# Patient Record
Sex: Female | Born: 1960 | Race: White | Hispanic: No | Marital: Married | Smoking: Never smoker
Health system: Southern US, Community
[De-identification: ages and names within clinical notes are randomized; demographics above are authoritative.]

## PROBLEM LIST (undated history)

## (undated) DIAGNOSIS — R112 Nausea with vomiting, unspecified: Secondary | ICD-10-CM

## (undated) DIAGNOSIS — Z8489 Family history of other specified conditions: Secondary | ICD-10-CM

## (undated) DIAGNOSIS — Z8719 Personal history of other diseases of the digestive system: Secondary | ICD-10-CM

## (undated) DIAGNOSIS — T8859XA Other complications of anesthesia, initial encounter: Secondary | ICD-10-CM

## (undated) DIAGNOSIS — C44311 Basal cell carcinoma of skin of nose: Secondary | ICD-10-CM

## (undated) DIAGNOSIS — K589 Irritable bowel syndrome without diarrhea: Secondary | ICD-10-CM

## (undated) DIAGNOSIS — J302 Other seasonal allergic rhinitis: Secondary | ICD-10-CM

## (undated) DIAGNOSIS — K219 Gastro-esophageal reflux disease without esophagitis: Secondary | ICD-10-CM

## (undated) DIAGNOSIS — T4145XA Adverse effect of unspecified anesthetic, initial encounter: Secondary | ICD-10-CM

## (undated) DIAGNOSIS — Z9889 Other specified postprocedural states: Secondary | ICD-10-CM

## (undated) DIAGNOSIS — E039 Hypothyroidism, unspecified: Secondary | ICD-10-CM

## (undated) DIAGNOSIS — M1990 Unspecified osteoarthritis, unspecified site: Secondary | ICD-10-CM

## (undated) DIAGNOSIS — J189 Pneumonia, unspecified organism: Secondary | ICD-10-CM

## (undated) HISTORY — PX: CHOLECYSTECTOMY: SHX55

## (undated) HISTORY — PX: SHOULDER ARTHROSCOPY W/ ROTATOR CUFF REPAIR: SHX2400

## (undated) HISTORY — PX: HERNIA REPAIR: SHX51

## (undated) HISTORY — PX: CARPAL TUNNEL RELEASE: SHX101

## (undated) HISTORY — PX: ABDOMINAL HYSTERECTOMY: SHX81

## (undated) HISTORY — PX: INCISIONAL HERNIA REPAIR: SHX193

---

## 1997-09-10 ENCOUNTER — Ambulatory Visit (HOSPITAL_BASED_OUTPATIENT_CLINIC_OR_DEPARTMENT_OTHER): Admission: RE | Admit: 1997-09-10 | Discharge: 1997-09-10 | Payer: Self-pay | Admitting: Orthopedic Surgery

## 1997-10-08 ENCOUNTER — Ambulatory Visit (HOSPITAL_BASED_OUTPATIENT_CLINIC_OR_DEPARTMENT_OTHER): Admission: RE | Admit: 1997-10-08 | Discharge: 1997-10-08 | Payer: Self-pay | Admitting: Orthopedic Surgery

## 1998-01-03 ENCOUNTER — Other Ambulatory Visit: Admission: RE | Admit: 1998-01-03 | Discharge: 1998-01-03 | Payer: Self-pay | Admitting: *Deleted

## 1998-04-09 ENCOUNTER — Other Ambulatory Visit: Admission: RE | Admit: 1998-04-09 | Discharge: 1998-04-09 | Payer: Self-pay | Admitting: *Deleted

## 1998-05-03 ENCOUNTER — Inpatient Hospital Stay (HOSPITAL_COMMUNITY): Admission: AD | Admit: 1998-05-03 | Discharge: 1998-05-03 | Payer: Self-pay | Admitting: *Deleted

## 1998-05-12 ENCOUNTER — Observation Stay (HOSPITAL_COMMUNITY): Admission: AD | Admit: 1998-05-12 | Discharge: 1998-05-12 | Payer: Self-pay | Admitting: *Deleted

## 1998-05-18 ENCOUNTER — Inpatient Hospital Stay (HOSPITAL_COMMUNITY): Admission: AD | Admit: 1998-05-18 | Discharge: 1998-05-18 | Payer: Self-pay | Admitting: *Deleted

## 1998-06-21 ENCOUNTER — Inpatient Hospital Stay (HOSPITAL_COMMUNITY): Admission: AD | Admit: 1998-06-21 | Discharge: 1998-06-21 | Payer: Self-pay | Admitting: Obstetrics and Gynecology

## 1999-02-12 ENCOUNTER — Emergency Department (HOSPITAL_COMMUNITY): Admission: EM | Admit: 1999-02-12 | Discharge: 1999-02-12 | Payer: Self-pay | Admitting: Emergency Medicine

## 1999-02-12 ENCOUNTER — Encounter: Payer: Self-pay | Admitting: Emergency Medicine

## 1999-03-03 ENCOUNTER — Encounter: Payer: Self-pay | Admitting: Surgery

## 1999-03-03 ENCOUNTER — Encounter (INDEPENDENT_AMBULATORY_CARE_PROVIDER_SITE_OTHER): Payer: Self-pay

## 1999-03-03 ENCOUNTER — Observation Stay (HOSPITAL_COMMUNITY): Admission: RE | Admit: 1999-03-03 | Discharge: 1999-03-04 | Payer: Self-pay | Admitting: Surgery

## 1999-09-01 ENCOUNTER — Other Ambulatory Visit: Admission: RE | Admit: 1999-09-01 | Discharge: 1999-09-01 | Payer: Self-pay | Admitting: Obstetrics and Gynecology

## 2000-09-13 ENCOUNTER — Other Ambulatory Visit: Admission: RE | Admit: 2000-09-13 | Discharge: 2000-09-13 | Payer: Self-pay | Admitting: Obstetrics and Gynecology

## 2002-02-09 ENCOUNTER — Other Ambulatory Visit: Admission: RE | Admit: 2002-02-09 | Discharge: 2002-02-09 | Payer: Self-pay | Admitting: Obstetrics and Gynecology

## 2002-04-11 ENCOUNTER — Encounter: Payer: Self-pay | Admitting: Obstetrics and Gynecology

## 2002-04-11 ENCOUNTER — Encounter: Admission: RE | Admit: 2002-04-11 | Discharge: 2002-04-11 | Payer: Self-pay | Admitting: Obstetrics and Gynecology

## 2003-04-04 ENCOUNTER — Other Ambulatory Visit: Admission: RE | Admit: 2003-04-04 | Discharge: 2003-04-04 | Payer: Self-pay | Admitting: Obstetrics and Gynecology

## 2003-05-07 ENCOUNTER — Encounter: Admission: RE | Admit: 2003-05-07 | Discharge: 2003-05-07 | Payer: Self-pay | Admitting: Obstetrics and Gynecology

## 2004-07-08 ENCOUNTER — Other Ambulatory Visit: Admission: RE | Admit: 2004-07-08 | Discharge: 2004-07-08 | Payer: Self-pay | Admitting: Obstetrics and Gynecology

## 2004-07-28 ENCOUNTER — Encounter: Admission: RE | Admit: 2004-07-28 | Discharge: 2004-07-28 | Payer: Self-pay | Admitting: Obstetrics and Gynecology

## 2005-09-07 ENCOUNTER — Encounter: Admission: RE | Admit: 2005-09-07 | Discharge: 2005-09-07 | Payer: Self-pay | Admitting: Obstetrics and Gynecology

## 2005-09-23 ENCOUNTER — Other Ambulatory Visit: Admission: RE | Admit: 2005-09-23 | Discharge: 2005-09-23 | Payer: Self-pay | Admitting: Obstetrics and Gynecology

## 2006-05-17 HISTORY — PX: CARDIAC CATHETERIZATION: SHX172

## 2006-07-15 ENCOUNTER — Encounter: Admission: RE | Admit: 2006-07-15 | Discharge: 2006-07-15 | Payer: Self-pay | Admitting: Obstetrics and Gynecology

## 2007-01-04 ENCOUNTER — Inpatient Hospital Stay (HOSPITAL_COMMUNITY): Admission: EM | Admit: 2007-01-04 | Discharge: 2007-01-06 | Payer: Self-pay | Admitting: Emergency Medicine

## 2007-01-04 ENCOUNTER — Ambulatory Visit: Payer: Self-pay | Admitting: Cardiology

## 2007-01-04 ENCOUNTER — Ambulatory Visit: Payer: Self-pay | Admitting: Internal Medicine

## 2007-01-05 ENCOUNTER — Encounter (INDEPENDENT_AMBULATORY_CARE_PROVIDER_SITE_OTHER): Payer: Self-pay | Admitting: Internal Medicine

## 2007-03-24 ENCOUNTER — Encounter: Admission: RE | Admit: 2007-03-24 | Discharge: 2007-03-24 | Payer: Self-pay | Admitting: Obstetrics and Gynecology

## 2007-04-18 ENCOUNTER — Encounter: Admission: RE | Admit: 2007-04-18 | Discharge: 2007-04-18 | Payer: Self-pay | Admitting: Gastroenterology

## 2007-05-18 HISTORY — PX: NECK SURGERY: SHX720

## 2007-11-28 ENCOUNTER — Encounter: Admission: RE | Admit: 2007-11-28 | Discharge: 2007-11-28 | Payer: Self-pay | Admitting: Orthopedic Surgery

## 2008-02-23 ENCOUNTER — Ambulatory Visit (HOSPITAL_COMMUNITY): Admission: RE | Admit: 2008-02-23 | Discharge: 2008-02-23 | Payer: Self-pay | Admitting: Obstetrics and Gynecology

## 2008-02-23 ENCOUNTER — Encounter (INDEPENDENT_AMBULATORY_CARE_PROVIDER_SITE_OTHER): Payer: Self-pay | Admitting: Obstetrics and Gynecology

## 2008-03-25 ENCOUNTER — Encounter: Admission: RE | Admit: 2008-03-25 | Discharge: 2008-03-25 | Payer: Self-pay | Admitting: Obstetrics and Gynecology

## 2009-03-27 ENCOUNTER — Encounter: Admission: RE | Admit: 2009-03-27 | Discharge: 2009-03-27 | Payer: Self-pay | Admitting: Obstetrics and Gynecology

## 2009-06-04 ENCOUNTER — Ambulatory Visit (HOSPITAL_COMMUNITY): Admission: RE | Admit: 2009-06-04 | Discharge: 2009-06-06 | Payer: Self-pay | Admitting: General Surgery

## 2009-11-12 ENCOUNTER — Encounter: Admission: RE | Admit: 2009-11-12 | Discharge: 2009-11-12 | Payer: Self-pay | Admitting: Family Medicine

## 2010-04-27 ENCOUNTER — Encounter
Admission: RE | Admit: 2010-04-27 | Discharge: 2010-04-27 | Payer: Self-pay | Source: Home / Self Care | Attending: Obstetrics and Gynecology | Admitting: Obstetrics and Gynecology

## 2010-06-07 ENCOUNTER — Encounter: Payer: Self-pay | Admitting: Obstetrics and Gynecology

## 2010-08-02 LAB — BASIC METABOLIC PANEL
BUN: 10 mg/dL (ref 6–23)
CO2: 28 mEq/L (ref 19–32)
Calcium: 8.9 mg/dL (ref 8.4–10.5)
Calcium: 8.1 mg/dL — ABNORMAL LOW (ref 8.4–10.5)
Creatinine, Ser: 0.69 mg/dL (ref 0.4–1.2)
GFR calc Af Amer: >60 mL/min (ref >60)
GFR calc Af Amer: >60 mL/min (ref >60)
GFR calc non Af Amer: >60 mL/min (ref >60)
GFR calc non Af Amer: >60 mL/min (ref >60)
Glucose, Bld: 99 mg/dL (ref 70–99)
Sodium: 137 mEq/L (ref 135–145)

## 2010-08-02 LAB — DIFFERENTIAL
Basophils Absolute: 0 10*3/uL (ref 0.0–0.1)
Eosinophils Absolute: 0.1 10*3/uL (ref 0.0–0.7)
Eosinophils Relative: 2 % (ref 0–5)
Monocytes Absolute: 0.4 10*3/uL (ref 0.1–1.0)

## 2010-08-02 LAB — CBC
Hemoglobin: 12.1 g/dL (ref 12.0–15.0)
WBC: 5.7 10*3/uL (ref 4.0–10.5)
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
## 2010-09-29 NOTE — H&P (Signed)
[name], SCRIVENER                  ACCOUNT NO.:  [id number]   MEDICAL RECORD NO.:  [id number]          PATIENT TYPE:  EMS   LOCATION:  MAJO                         FACILITY:  [facility]   PHYSICIAN:  Ladell Pier, M.D.   DATE OF BIRTH:  Jan 13, 1961   DATE OF ADMISSION:  01/04/2007  DATE OF DISCHARGE:                              HISTORY & PHYSICAL   CHIEF COMPLAINT:  Chest pain.   HISTORY OF PRESENT ILLNESS:  The patient is a 50 year old female, past  medical history significant for obesity and asthma.  The patient states  that yesterday she had a persistent chest pain.  Pain in the center of  chest radiating to her back.  She tried taking Rolaids and Tums without  any relief in her symptoms.  She stated that all night the chest pain  persisted.  It radiates to her back with some tenderness in her back.  She presented to her primary care physician's office this morning.  She  had an EKG done that is different from the EKG she had previously.  She  had some poor R wave progression.  She has no shortness of breath.  She  only experienced the heaviness in her chest.  She had nitroglycerin and  morphine in the emergency room with minimal relief of her chest pain.   PAST MEDICAL HISTORY:  Significant for:  1. Obesity.  2. Asthma.   FAMILY HISTORY:  Mother died of breast cancer at 57.  Father history  unknown.  Three brothers and two sisters that are healthy.   SOCIAL HISTORY:  The patient is married, four children.  She is a  Futures trader.  No tobacco or alcohol use.   MEDICATIONS:  Albuterol p.r.n.   ALLERGIES:  None.   REVIEW OF SYSTEMS:  As stated in the HPI.   PHYSICAL EXAMINATION:  VITAL SIGNS:  Temperature 98.1, blood pressure  113/72, pulse 77, respirations 18, pulse ox 96% on room air.  HEENT:  Normocephalic, atraumatic.  Pupils reactive to light.  Throat  without erythema.  CARDIOVASCULAR:  Regular rate and rhythm.  LUNGS:  Clear bilaterally.  ABDOMEN:  Positive bowel  sounds.  EXTREMITIES:  No edema.   LABORATORY DATA:  EKG showed poor R wave progression.  Chest x-ray:  No  acute disease.  D-dimers are less than 0.22.  Sodium 137, potassium 4.0,  chloride 106, CO2 of 24, BUN 9, creatinine 0.8, glucose 98.   ASSESSMENT AND PLAN:  Chest pain:  Differential diagnoses  gastroesophageal reflux disease versus cardiac versus anxiety versus  aortic dissection versus endocarditis.  The patient has risk factor with  her obesity is her cardiac  risk factor.  She does not have any history of hypertension, diabetes or  high cholesterol.  We will check her cholesterol levels, cycle enzymes.  Get a cardiology consult.  Get a 2-D echocardiogram.  Start her on  aspirin.  Check blood pressure in both arms.      Ladell Pier, M.D.  Electronically Signed     NJ/MEDQ  D:  01/04/2007  T:  01/05/2007  Job:  [id number] 

## 2010-09-29 NOTE — Cardiovascular Report (Signed)
[name], [name]                  ACCOUNT NO.:  [id number]   MEDICAL RECORD NO.:  [id number]          PATIENT TYPE:  INP   LOCATION:  4741                         FACILITY:  [facility]   PHYSICIAN:  Bevelyn Buckles. Bensimhon, MDDATE OF BIRTH:  03-28-1961   DATE OF PROCEDURE:  01/06/2007  DATE OF DISCHARGE:                            CARDIAC CATHETERIZATION   PRIMARY CARE PHYSICIAN:  Evelena Peat, M.D., [facility].   PATIENT IDENTIFICATION:  [name] is a very pleasant 50 year old woman  with a history of morbid obesity and asthma.  She was admitted with  chest pain. EKG was nonacute and she ruled out for myocardial infarction  with serial cardiac markers.  The risks of cath versus stress test were  explained and the patient has decided to pursue catheterization.   PROCEDURES PERFORMED:  1. Selective coronary angiography.  2. Left heart catheterization.  3. Left ventriculogram.  4. Angio-Seal closure of the common femoral arteriotomy site.   DESCRIPTION OF PROCEDURE:  The risks and indication of the  catheterization were explained.  Consent was signed and placed on the  chart.  A 5-French arterial sheath was placed in the right femoral  artery using a modified Seldinger technique.  Standard catheters  including JL-4, JR-4 and angled pigtail were used for the procedure.  All catheter exchanges were made over a wire.  There were no apparent  complications.  At the end the procedure, the arteriotomy site was  closed with Angio-Seal closure device.  There was good hemostasis.   Central aortic pressure 117/80 with a mean of 98.  LV pressure 120/14  with an EDP of 20.  There was no aortic stenosis.   Left main was normal.   LAD was a long vessel coursing to the apex.  It was angiographically  normal.   Left circumflex was a huge dominant vessel with a very large ramus  branch, small OM-1, moderate-to-large OM-2 and a large PDA.  It is  angiographically normal.   Right coronary artery was a small nondominant vessel.  It was  angiographically normal.   Left ventriculogram done in RAO position showed an EF of 55% with normal  wall motion.  No mitral regurgitation.   ASSESSMENT:  1. Normal coronary arteries.  2. Normal left ventricular function.   PLAN/DISCUSSION:  Probable noncardiac chest pain.  She can go home today  if her groin remains stable.  She will need continued risk factor  modification.      Bevelyn Buckles. Bensimhon, MD  Electronically Signed     DRB/MEDQ  D:  01/06/2007  T:  01/07/2007  Job:  [id number]   cc:   Evelena Peat, M.D.

## 2010-09-29 NOTE — Op Note (Signed)
[name], PACIFICO                  ACCOUNT NO.:  [id number]   MEDICAL RECORD NO.:  [id number]          PATIENT TYPE:  AMB   LOCATION:  SDC                           FACILITY:  [facility]   PHYSICIAN:  Dois Davenport A. Rivard, M.D. DATE OF BIRTH:  Mar 06, 1961   DATE OF PROCEDURE:  02/23/2008  DATE OF DISCHARGE:                               OPERATIVE REPORT   PREOPERATIVE DIAGNOSES:  Dysfunctional uterine bleeding with endometrial  polyps and persistent left ovarian cyst.   POSTOPERATIVE DIAGNOSES:  Dysfunctional uterine bleeding with  endometrial polyps and persistent left ovarian cyst plus left paratubal  cyst.   ANESTHESIA:  General, Angelica Pou, MD   PROCEDURE:  Hysteroscopy, resection of endometrial polyps, dilation and  curettage, laparoscopy with left salpingo-oophorectomy.   SURGEON:  Crist Fat. Rivard, MD   ASSISTANT:  Elmira J. Lowell Guitar, physician assistant.   ESTIMATED BLOOD LOSS:  Minimal.   PROCEDURE:  After being informed of the planned procedure with possible  complications including bleeding, infection, injury to uterus, injury to  bowel, bladder, or ureters, need for laparotomy, informed consent was  obtained.  The patient was taken to OR #7, given general anesthesia with  endotracheal intubation without any complication.  She was placed in the  lithotomy position, prepped and draped in a sterile fashion, and a Foley  catheter was inserted in her bladder.  Pelvic exam revealed an  anteverted uterus, somewhat increased in size maybe 8-10 weeks, mobile,  2 normal adnexa.  Please note that pelvic exam is definitely limited by  body habitus.   We placed a weighted speculum in the vagina and grasped the cervix with  the tenaculum forceps.  We proceeded with a paracervical block using 16  mL of Novocaine 1%.  Uterus was sounded at 12-cm and the cervix was  easily dilated using Hegar dilator until #31, which allowed for easy  entry of the hysteroscope with a single loop.  Using  sorbitol with a  maximum pressure of 85 mmHg, we were able to visualize the entire  uterine cavity with both tubal ostia.  We did found a small polyp in the  left upper posterior aspect of the uterus measuring 0.5 cm and also a  polypoid posterior endometrium at the fundus which was maybe on a  distance of 1.5 cm.  Using the single loop, we resected these 2 areas  and sent them separately.  We removed the hysteroscope and performed a  sharp curettage of the endometrial cavity which removed a moderate to  large amount of normal-appearing endometrium.  After the curettage was  completed, we returned inside the uterus with the hysteroscope and  visualized a normal endometrial cavity.  Instruments were then removed.  An acorn intrauterine manipulator was placed in the uterus and the  weighted speculum was removed.  We then moved to the laparoscopy part of  our procedure and we infiltrated the umbilicus with 5 mL of Marcaine  0.25 and performed a semielliptical incision.  This incision was brought  down bluntly to the fascia which was identified and easily grasped with  Kocher forceps.  The fascia was incised using Mayo scissors and the  peritoneum was entered bluntly.  A pursestring suture of 0 Vicryl was  placed on the fascia and a 10-mm Hassan trocar was inserted in the  abdominal cavity, held in place with the pursestring suture.  This  allowed for easy insufflation of a pneumoperitoneum with CO2 at a  maximum pressure of 15 mmHg.  We then infiltrated the suprapubic area  with 5 mL of Marcaine 0.25 and performed a 10-mm incision and inserted a  10-mm trocar under direct visualization, and we then inserted a 5-mm  left lower quadrant trocar under direct physician after infiltrating  with 2 mL of Marcaine 0.25.   OBSERVATIONS:  Anterior cul-de-sac was normal.  The uterus was globulus  and a little soft may be compatible with adenomyosis.  No uterine  fibroids were identified.  Posterior  cul-de-sac was normal.  Right tube  and right ovary were normal.  Left tube had a paratubal cyst measuring  3.3-3.5 cm and the left ovary had a simple clear cyst measuring 4 cm.  Those were easily mobile, but we do have some adhesions with the sigmoid  colon which will shadow the area where we need to identify passage of  the ureter.  The appendix was visualized and normal.  The liver was  visualized and normal.  No other pathology was found in the pelvis.   We proceeded with pelvic washings using the Nezhat irrigator-aspirator,  and then using monopolar hot scissors, we proceeded with sharp  dissection of adhesions to free the sigmoid from the left pelvic wall.  We could now easily identified the infundibulopelvic ligament and also  easily identify the course of the ureter which was way below our site of  cauterization.  Now using a tripolar gyrus energy forceps, we grasped  the left infundibulopelvic ligament and cauterized and sectioned it.  We  then grasped the tube, cauterized and sectioned it.  We can now go  through the mesosalpinx, cauterized and sectioned it, and then we  grasped the utero-ovarian ligament cauterized and sectioned it, which  frees the adnexa with its tube completely.  The site of resection had a  normal hemostasis.  Through the 10-mm suprapubic trocar, we inserted an  Endobag and we deposited the specimen in the bag and brought it outside  the suprapubic area.  Trocar was removed.  The back was opened.  The  specimen was grasped inside the bag, morcellated.  Both cysts were  perforated and aspirated and the specimen was removed completely.  The  10-mm trocar was reinserted and we returned in the pelvic cavity for  profuse irrigation with warm saline.  We do have a small site of oozing  near the left uterine cornua which was cauterized with the tripolar.  We  again irrigated profusely with warm saline and noted a satisfactory  hemostasis with an intact ureter with  normal peristaltism.  Trocars were  removed under direct visualization.  Pneumoperitoneum was evacuated.   The umbilical fascia was closed with a previously placed pursestring  suture of 0 Vicryl and the fascia of the suprapubic incision was closed  with a figure-of-eight stitch of 0 Vicryl.  All incisions were then  closed with subcuticular suture of 3-0 Monocryl and Dermabond.   Instruments and sponge count was complete x2.  Estimated blood loss was  minimal.  The procedure was very well tolerated by the patient who was  taken to recovery room  in a well and stable condition.   SPECIMENS:  Left tube, left ovary, endometrial polyps, and endometrial  curettings, all sent to pathology.      Crist Fat Rivard, M.D.  Electronically Signed     SAR/MEDQ  D:  02/23/2008  T:  02/24/2008  Job:  [id number]
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
## 2010-09-29 NOTE — H&P (Signed)
[name], SULLIVANT                  ACCOUNT NO.:  [id number]   MEDICAL RECORD NO.:  [id number]          PATIENT TYPE:  INP   LOCATION:  4741                         FACILITY:  [facility]   PHYSICIAN:  Ladell Pier, M.D.   DATE OF BIRTH:  23-May-1960   DATE OF ADMISSION:  01/04/2007  DATE OF DISCHARGE:                              HISTORY & PHYSICAL   CHIEF COMPLAINT:  Chest pain.   HISTORY OF PRESENT ILLNESS:  Patient is a 50 year old white female that  presented with past medical history significant for obesity and asthma.  Patient stated for the past day, yesterday, she has been having chest  pain.  She has tried [employer].  She has tried Gas-X without any relief.  Her husband takes some medication for ingestion, reflux symptoms.  She  tried the prescription medication without any relief.  She complains of  chest pressure with heaviness radiating mid scapula and tenderness in  her back region to palpation.  She presented to her primary care  physician this morning secondary to the persistence of the chest pain.  She had an EKG done that showed poor R wave progression compared to her  earlier EKG.  She was sent over to the hospital to be admitted.  She has  no shortness of breath.  In the ER, she received nitroglycerin and  aspirin without any relief of her chest pain.  She also received a GI  cocktail and morphine.  Her chest pain has slightly improved.   PAST MEDICAL HISTORY:  Significant for her obesity and asthma.   FAMILY HISTORY:  Her mother died of breast cancer at 24.  Her father's  history is unknown.   SOCIAL HISTORY:  She is married.  She is a housewife.  She has 4  children.  She does not smoke or drink alcohol or use IV drugs.   MEDICATIONS:  Albuterol p.r.n.   ALLERGIES:  None.   REVIEW OF SYMPTOMS:  As per stated in the HPI.   PHYSICAL EXAMINATION:  VITAL SIGNS:  Temperature 98.1, blood pressure  130/72, pulse 77, respirations 18, pulse oximetry 97% on room air.  HEENT:  Head normocephalic, atraumatic.  Pupils equal, round and  reactive to light.  Throat without erythema.  CARDIOVASCULAR:  Regular rate and rhythm.  LUNGS:  Clear bilaterally.  ABDOMEN:  Positive bowel sounds.  EXTREMITIES:  No edema.   LABORATORY DATA:  Chest x-ray normal.  EKG poor R wave progression.  D-  dimer negative.  Sodium 137, potassium 4.0, chloride 106, CO2 24, BUN 9,  creatinine 0.8, glucose 98.   ASSESSMENT/PLAN:  Chest pain.  Differential diagnosis includes GERD  versus cardiac versus anxiety versus aortic dissection versus  endocarditis versus pulmonary embolism.  She ruled out for PE with  negative D-dimer.  Cardiac enzymes thus far are normal.  We will admit  her to telemetry bed observation.  Will get a cardiology consult, get a  2D echo, start her on aspirin, check enzymes.  Check also fasting lipid  panel.      Ladell Pier, M.D.  Electronically Signed  [place]/MEDQ  D:  01/04/2007  T:  01/05/2007  Job:  [id number]

## 2010-10-13 ENCOUNTER — Ambulatory Visit (HOSPITAL_COMMUNITY)
Admission: RE | Admit: 2010-10-13 | Discharge: 2010-10-13 | Disposition: A | Discharge status: Home / Self Care | Payer: BC Managed Care – PPO | Source: Ambulatory Visit | Attending: Family Medicine | Admitting: Family Medicine

## 2010-10-13 DIAGNOSIS — M79609 Pain in unspecified limb: Secondary | ICD-10-CM | POA: Insufficient documentation

## 2010-10-13 DIAGNOSIS — M7989 Other specified soft tissue disorders: Secondary | ICD-10-CM | POA: Insufficient documentation

## 2011-02-16 LAB — CBC
HCT: 38.2
MCV: 93
Platelets: 284
RBC: 4.1
RDW: 14
WBC: 5.5

## 2011-02-26 LAB — CBC
HCT: 32.7 — ABNORMAL LOW
HCT: 34.9 — ABNORMAL LOW
HCT: 35.8 — ABNORMAL LOW
Hemoglobin: 11.3 — ABNORMAL LOW
Hemoglobin: 11.9 — ABNORMAL LOW
MCHC: 34.7
MCHC: 34.7
MCV: 89.1
MCV: 90.4
MCV: 90.9
Platelets: 228
Platelets: 283
RBC: 4.02
RDW: 13.2
RDW: 13.3
RDW: 13.6

## 2011-02-26 LAB — COMPREHENSIVE METABOLIC PANEL
ALT: 64 — ABNORMAL HIGH
Albumin: 3.2 — ABNORMAL LOW
Alkaline Phosphatase: 67
BUN: 6
CO2: 26
Calcium: 9
Creatinine, Ser: 0.63
GFR calc non Af Amer: >60
Glucose, Bld: 92
Glucose, Bld: 92
Sodium: 138
Total Bilirubin: 0.6
Total Bilirubin: 1
Total Protein: 6

## 2011-02-26 LAB — CARDIAC PANEL(CRET KIN+CKTOT+MB+TROPI)
CK, MB: 0.9
Relative Index: INVALID
Total CK: 56
Troponin I: 0.02

## 2011-02-26 LAB — I-STAT 8, (EC8 V) (CONVERTED LAB)
Bicarbonate: 24.1 — ABNORMAL HIGH
Hemoglobin: 13.3
Potassium: 4
Sodium: 137

## 2011-02-26 LAB — DIFFERENTIAL
Basophils Absolute: 0
Eosinophils Relative: 1
Lymphocytes Relative: 26
Neutro Abs: 3.4

## 2011-02-26 LAB — TROPONIN I: Troponin I: 0.02

## 2011-02-26 LAB — FERRITIN: Ferritin: 88 (ref 10–291)

## 2011-02-26 LAB — MITOCHONDRIAL ANTIBODIES: Mitochondrial M2 Ab, IgG: <20.1 Units (ref <20.1)

## 2011-02-26 LAB — POCT CARDIAC MARKERS
Myoglobin, poc: 36.5
Operator id: 285491
Troponin i, poc: <0.05
Troponin i, poc: <0.05

## 2011-02-26 LAB — ANA: Anti Nuclear Antibody(ANA): NEGATIVE

## 2011-02-26 LAB — CK TOTAL AND CKMB (NOT AT ARMC): Relative Index: INVALID

## 2011-02-26 LAB — LIPID PANEL
LDL Cholesterol: 106 — ABNORMAL HIGH
VLDL: 26

## 2011-02-26 LAB — PROTIME-INR: Prothrombin Time: 13.9

## 2011-02-26 LAB — POCT I-STAT CREATININE
Creatinine, Ser: 0.8
Operator id: 285491

## 2011-02-26 LAB — D-DIMER, QUANTITATIVE (NOT AT ARMC): D-Dimer, Quant: 0.22

## 2011-02-26 LAB — HEPATITIS PANEL, ACUTE: HCV Ab: NEGATIVE

## 2011-04-23 ENCOUNTER — Emergency Department (HOSPITAL_COMMUNITY): Payer: BC Managed Care – PPO

## 2011-04-23 ENCOUNTER — Encounter: Payer: Self-pay | Admitting: *Deleted

## 2011-04-23 ENCOUNTER — Emergency Department (HOSPITAL_COMMUNITY)
Admission: EM | Admit: 2011-04-23 | Discharge: 2011-04-23 | Disposition: A | Discharge status: Home / Self Care | Payer: BC Managed Care – PPO | Attending: Emergency Medicine | Admitting: Emergency Medicine

## 2011-04-23 DIAGNOSIS — J3489 Other specified disorders of nose and nasal sinuses: Secondary | ICD-10-CM | POA: Insufficient documentation

## 2011-04-23 DIAGNOSIS — R059 Cough, unspecified: Secondary | ICD-10-CM | POA: Insufficient documentation

## 2011-04-23 DIAGNOSIS — J069 Acute upper respiratory infection, unspecified: Secondary | ICD-10-CM | POA: Insufficient documentation

## 2011-04-23 DIAGNOSIS — J029 Acute pharyngitis, unspecified: Secondary | ICD-10-CM | POA: Insufficient documentation

## 2011-04-23 DIAGNOSIS — R05 Cough: Secondary | ICD-10-CM

## 2011-04-23 DIAGNOSIS — R0602 Shortness of breath: Secondary | ICD-10-CM | POA: Insufficient documentation

## 2011-04-23 DIAGNOSIS — J45909 Unspecified asthma, uncomplicated: Secondary | ICD-10-CM | POA: Insufficient documentation

## 2011-04-23 DIAGNOSIS — H9209 Otalgia, unspecified ear: Secondary | ICD-10-CM | POA: Insufficient documentation

## 2011-04-23 MED ORDER — ALBUTEROL SULFATE (5 MG/ML) 0.5% IN NEBU
5.0000 mg | INHALATION_SOLUTION | Freq: Once | RESPIRATORY_TRACT | Status: AC
  Administered 2011-04-23: 5 mg via RESPIRATORY_TRACT
  Filled 2011-04-23: qty 1

## 2011-04-23 MED ORDER — PREDNISONE 20 MG PO TABS: ORAL_TABLET | ORAL | Status: AC

## 2011-04-23 MED ORDER — GUAIFENESIN-CODEINE 100-10 MG/5ML PO SYRP: 5.0000 mL | ORAL_SOLUTION | Freq: Three times a day (TID) | ORAL | Status: AC | PRN

## 2011-04-23 MED ORDER — ALBUTEROL SULFATE (2.5 MG/3ML) 0.083% IN NEBU: 2.5000 mg | INHALATION_SOLUTION | RESPIRATORY_TRACT | Status: DC | PRN

## 2011-04-23 MED ORDER — IPRATROPIUM BROMIDE 0.02 % IN SOLN
0.5000 mg | Freq: Once | RESPIRATORY_TRACT | Status: AC
  Administered 2011-04-23: 0.5 mg via RESPIRATORY_TRACT
  Filled 2011-04-23: qty 2.5

## 2011-04-23 NOTE — ED Notes (Signed)
Supplied patients family with soda.

## 2011-04-23 NOTE — ED Notes (Signed)
Patient reports she had the flu 1 week ago.  She developed increasing sob and coughing spells since yesterday.  She is talking in a wisper and breathing fast.

## 2011-04-23 NOTE — ED Notes (Signed)
Has been feeling bad x 1 week  Cough sorethroat aching  Went to dr and was told she had the flu no xray done. Not feeling better  Still coughing  And now has some blood in sputum hurts to breath

## 2011-04-23 NOTE — ED Provider Notes (Signed)
History     CSN: [id number] Arrival date & time: 04/23/2011 10:17 AM   First MD Initiated Contact with Patient 04/23/11 1128      Chief Complaint  Patient presents with  . Shortness of Breath  . URI    (Consider location/radiation/quality/duration/timing/severity/associated sxs/prior treatment) HPI Comments: Patient with history of the flu that began one week ago. Patient has had persisting coughing. She has been placed on a Z-Pak by her primary care doctor. She recently finished a course of steroids. She also has been using hydrocodone cough syrup. Patient hasn't had fever at onset but not over the past couple of days. She has had nasal congestion and cough productive of green sputum. Patient denies vomiting or diarrhea. She has a past medical history of asthma for which she uses albuterol inhaler. She denies COPD or other lung problems. Patient denies smoking.  Patient is a 50 y.o. female presenting with shortness of breath and URI. The history is provided by the patient.  Shortness of Breath  The current episode started more than 1 week ago. Associated symptoms include rhinorrhea, sore throat, cough and shortness of breath. Pertinent negatives include no chest pain, no chest pressure, no fever and no wheezing. She is currently using steroids.  URI The primary symptoms include ear pain, sore throat and cough. Primary symptoms do not include fever, headaches, wheezing, abdominal pain, nausea, vomiting, myalgias or rash.  Symptoms associated with the illness include congestion and rhinorrhea. The illness is not associated with chills.    Past Medical History  Diagnosis Date  . Asthma     History reviewed. No pertinent past surgical history.  No family history on file.  History  Substance Use Topics  . Smoking status: Never Smoker   . Smokeless tobacco: Not on file  . Alcohol Use: No    OB History    Grav Para Term Preterm Abortions TAB SAB Ect Mult Living                   Review of Systems  Constitutional: Negative for fever and chills.  HENT: Positive for ear pain, congestion, sore throat and rhinorrhea.   Eyes: Negative for discharge.  Respiratory: Positive for cough and shortness of breath. Negative for wheezing.   Cardiovascular: Negative for chest pain.  Gastrointestinal: Negative for nausea, vomiting, abdominal pain, diarrhea and constipation.  Genitourinary: Negative for dysuria and hematuria.  Musculoskeletal: Negative for myalgias.  Skin: Negative for rash.  Neurological: Negative for headaches.  Psychiatric/Behavioral: Negative for confusion.    Allergies  Review of patient's allergies indicates no known allergies.  Home Medications   Current Outpatient Rx  Name Route Sig Dispense Refill  . ALBUTEROL SULFATE HFA 108 (90 BASE) MCG/ACT IN AERS Inhalation Inhale 2 puffs into the lungs every 6 (six) hours as needed. Shortness of breath      . ALBUTEROL SULFATE (2.5 MG/3ML) 0.083% IN NEBU Nebulization Take 2.5 mg by nebulization every 6 (six) hours as needed. Shortness of breath     . AZITHROMYCIN 250 MG PO TABS Oral Take 250 mg by mouth daily. On day 3 of course.     Marland Kitchen HYDROCODONE-ACETAMINOPHEN 5-325 MG PO TABS Oral Take 1 tablet by mouth every 6 (six) hours as needed. For pain       BP 98/72  Pulse 83  Temp(Src) 98.4 F (36.9 C) (Oral)  Resp 28  Ht 5\' 5"  (1.651 m)  Wt 265 lb (120.203 kg)  BMI 44.10 kg/m2  SpO2 99%  Physical Exam  Nursing note and vitals reviewed. Constitutional: She is oriented to person, place, and time. She appears well-developed and well-nourished.  HENT:  Head: Normocephalic and atraumatic.  Eyes: Right eye exhibits no discharge. Left eye exhibits no discharge.  Neck: Normal range of motion. Neck supple.  Cardiovascular: Normal rate and regular rhythm.  Exam reveals no gallop and no friction rub.   No murmur heard. Pulmonary/Chest: Effort normal and breath sounds normal. No respiratory distress. She has  no wheezes.        Coughing during exam  Abdominal: Soft. There is no tenderness. There is no rebound and no guarding.  Musculoskeletal: Normal range of motion.  Neurological: She is alert and oriented to person, place, and time.  Skin: Skin is warm and dry. No rash noted.  Psychiatric: She has a normal mood and affect.    ED Course  Procedures (including critical care time)  Labs Reviewed - No data to display Dg Chest 2 View  04/23/2011  *RADIOLOGY REPORT*  Clinical Data: Cough, fever, chills.  CHEST - 2 VIEW  Comparison: 05/30/2009  Findings: Heart and mediastinal contours are within normal limits. No focal opacities or effusions.  No acute bony abnormality.  IMPRESSION: No active cardiopulmonary disease.  Original Report Authenticated By: Cyndie Chime, M.D.     1. Cough    Patient seen and examined. Albuterol nebulizer given with relief of coughing while she was on the albuterol however she started coughing after the treatment. Patient informed that there is no evidence of pneumonia on x-ray. Will continue prednisone with a tapered dose pack, will switch patient's Tussionex to a cough syrup with codeine, and will refill patient's albuterol nebulizer solution. Patient and husband are comfortable with this plan and agree to follow up with primary care doctor if symptoms do not improve, patient urged to return with worsening shortness of breath, cough, fever, or she has any other concerns.  Patient counseled on use of narcotic pain medications. Counseled not to combine these medications with others containing tylenol. Urged not to drink alcohol, drive, or perform any other activities that requires focus while taking these medications. The patient verbalizes understanding and agrees with the plan.  X-ray reviewed by myself.    MDM  Patient with cough, no pneumonia on x-ray. She recently has flu symptoms with persistent cough. She is already on antibiotics. Suspect residual inflammation  of the recent respiratory infection. Patient appears well and is stable for discharge home.       [name] [place], [place] 04/23/11 1630

## 2011-04-23 NOTE — ED Notes (Signed)
Patient transported to X-ray 

## 2011-04-25 NOTE — ED Provider Notes (Signed)
History/physical exam/procedure(s) were performed by non-physician practitioner and as supervising physician I was immediately available for consultation/collaboration. I have reviewed all notes and am in agreement with care and plan.   Cressie Betzler S Marita Burnsed, MD 04/25/11 1059 

## 2011-05-28 ENCOUNTER — Other Ambulatory Visit: Payer: Self-pay | Admitting: Obstetrics and Gynecology

## 2011-05-28 DIAGNOSIS — Z1231 Encounter for screening mammogram for malignant neoplasm of breast: Secondary | ICD-10-CM

## 2011-05-31 ENCOUNTER — Ambulatory Visit
Admission: RE | Admit: 2011-05-31 | Discharge: 2011-05-31 | Disposition: A | Discharge status: Home / Self Care | Payer: BC Managed Care – PPO | Source: Ambulatory Visit | Attending: Obstetrics and Gynecology | Admitting: Obstetrics and Gynecology

## 2011-05-31 DIAGNOSIS — Z1231 Encounter for screening mammogram for malignant neoplasm of breast: Secondary | ICD-10-CM

## 2012-02-11 ENCOUNTER — Telehealth: Payer: Self-pay | Admitting: Obstetrics and Gynecology

## 2012-02-11 NOTE — Telephone Encounter (Signed)
SR pt 

## 2012-02-14 ENCOUNTER — Telehealth: Payer: Self-pay | Admitting: Obstetrics and Gynecology

## 2012-02-14 NOTE — Telephone Encounter (Signed)
Pt went to ER today. CT scan was done." Found cysts on R ovary and prominent endometrium in lining of uterus."  Pt had been spotting for 2 weeks and nausea. Appt sch for 02-15-12  ld

## 2012-02-15 ENCOUNTER — Encounter: Payer: Self-pay | Admitting: Obstetrics and Gynecology

## 2012-02-15 ENCOUNTER — Ambulatory Visit (INDEPENDENT_AMBULATORY_CARE_PROVIDER_SITE_OTHER): Payer: BC Managed Care – PPO

## 2012-02-15 ENCOUNTER — Other Ambulatory Visit: Payer: Self-pay | Admitting: Obstetrics and Gynecology

## 2012-02-15 ENCOUNTER — Ambulatory Visit (INDEPENDENT_AMBULATORY_CARE_PROVIDER_SITE_OTHER): Payer: BC Managed Care – PPO | Admitting: Obstetrics and Gynecology

## 2012-02-15 VITALS — BP 100/72 | Wt 280.0 lb

## 2012-02-15 DIAGNOSIS — N950 Postmenopausal bleeding: Secondary | ICD-10-CM

## 2012-02-15 DIAGNOSIS — R109 Unspecified abdominal pain: Secondary | ICD-10-CM

## 2012-02-15 HISTORY — PX: ROBOTIC ASSISTED TOTAL HYSTERECTOMY: SHX6085

## 2012-02-15 LAB — POCT URINALYSIS DIPSTICK
Blood, UA: NEGATIVE
Ketones, UA: NEGATIVE
Protein, UA: NEGATIVE
Spec Grav, UA: 1.015
Urobilinogen, UA: NEGATIVE
pH, UA: 5

## 2012-02-15 NOTE — Progress Notes (Deleted)
Subjective:     Patient ID: [name], female   DOB: Sep 16, 1960, 51 y.o.   MRN: [id number]  HPI   Review of Systems     Objective:   Physical Exam     Assessment:     ***    Plan:     ***

## 2012-02-15 NOTE — Progress Notes (Signed)
Subjective:    [name] is a 51 y.o. female, G82P4, who presents for spotting and nausea.  Had CT, pos for "cysts and endometrium." Unable to obtain report before 72 hours per [facility] pain on/off for 1 year. Colonoscopy was normal. This past weekend reports severe nausea, no vomiting, feeling really sick. For 2,3 weeks new onset of short stabbing  LLQ pain with random occurrence. CT-scan performed yesterday: pt was told to see OBGYN because endometrium was prominent. Last cycle July 2013 was on time. In past year has had cycles every 1-3 months with occasional use of Provera. Spotting on/off for 3 weeks usually just on paper when wipes. 1-2 episodes of soiling underwear. More watery then bloody discharge.   The following portions of the patient's history were reviewed and updated as appropriate: allergies, current medications, past family history.  Review of Systems Pertinent items are noted in HPI.    Gastrointestinal: Negative for abdominal pain, change in bowel habits or rectal bleeding Urinary:negative   Objective:    There were no vitals taken for this visit.    Weight:  Wt Readings from Last 1 Encounters:  04/23/11 265 lb (120.203 kg)          BMI: There is no height or weight on file to calculate BMI.  General Appearance: Alert, appropriate appearance for age. No acute distress Heart: normal Lungs: clear GYN exam:deferred  Pelvic ultrasound: Uterus normal            EM: increased 1.6 cm, with possibility of EM polyp            Left ovary surgically absent            Right ovary with dominant follicle    Assessment:    Post-menopausal vaginal bleeding  with suspicion of endometrial polyp ( history of)   Plan:    Recommend to proceed with HSC/polypectomy Pt may choose in-office or outpatient venue Procedure reviewed with R&B In-office instructions given on AVS     Butler Denmark PMD

## 2012-02-15 NOTE — Patient Instructions (Signed)
To: [name]  DOB: 28-Aug-1960   Date: 02/15/2012   Patient Instructions for in office Hysteroscopy  Please read several days prior to your procedure and follow the instructions.  If you have any questions, please contact the office.  1.  Pre-procedure and Post-procedure medications:  Pick up Motrin, Valium, Phenergan, and Vicodin from your pharmacy  Bring medications to the office when you come in for your procedure  2.  Begin taking Motrin 800mg  48 hours prior to procedure.  Take 1 tablet every 8 hours.  **Please notify doctor and do not take if you have a history of stomach ulcers or have been notified by a physician that you cannot take Motrin or Ibuprofen.  3. If your physician gave you Cytotec tablets at your appointment, insert 2 tablets into vagina at 6:00 pm the night before the procedure and 2 tablets at 6:00 am the morning of the procedure.  4.  Bring someone to your appointment who can be with you in the preparation area for the hour prior to your procedure, and who can drive you home afterward.  You will be at the office approximately 2 to 3 hours.  No driving within the first 24 hours or while still taking narcotic pain medicine.  5.  Wear comfortable, loose fitting clothing.  6.  DAY OF PROCEDURE:  No breakfast.  No Motrin the morning of the procedure.  No lunch until after your procedure.  Don't forget to bring your medications.  Drink plenty of fluids prior to your procedure and throughout the day.  No driving for 24 hours.  Bring someone with you the day of the appointment that can drive you home.  7.  Following the procedure:  You may experience cramping for 12 - 24 hours after treatment.  Take Motrin every 8 hours as prescribed for 3 doses today, and/Vicodin for breakthrough pain every 4 hours as needed.  You may resume normal activities tomorrow.  You may resume sexual intercourse in 1 week or after discharge has completely resolved.  You may resume  using tampons tomorrow.  Expect to experience vaginal discharge after the treatment for 6 - 10 days.  It is described as bloody during the first few days; pinkish by approximately one week; then profuse and watery thereafter.  If not already scheduled, please schedule your follow-up appointment 1 - 2 weeks from today.  Please take your antibiotics as prescribed by your physician after the procedure. May not apply to all patients.

## 2012-02-16 ENCOUNTER — Telehealth: Payer: Self-pay | Admitting: Obstetrics and Gynecology

## 2012-02-16 NOTE — Telephone Encounter (Signed)
PC from pt stating she and her husband discussed options and have decided on hysterectomy.  Will send this FYI to SR. Told pt she will be contacted for appointment to come in and discuss when/if necessary.  Pt agreeable.  ld

## 2012-02-17 ENCOUNTER — Telehealth: Payer: Self-pay | Admitting: Obstetrics and Gynecology

## 2012-02-22 ENCOUNTER — Encounter (HOSPITAL_COMMUNITY): Payer: Self-pay | Admitting: Pharmacy Technician

## 2012-02-24 ENCOUNTER — Encounter (HOSPITAL_COMMUNITY): Payer: Self-pay

## 2012-02-24 ENCOUNTER — Inpatient Hospital Stay (HOSPITAL_COMMUNITY): Admission: RE | Admit: 2012-02-24 | Payer: BC Managed Care – PPO | Source: Ambulatory Visit

## 2012-02-24 HISTORY — DX: Irritable bowel syndrome, unspecified: K58.9

## 2012-02-25 ENCOUNTER — Encounter (HOSPITAL_COMMUNITY): Payer: Self-pay

## 2012-02-25 ENCOUNTER — Encounter (HOSPITAL_COMMUNITY)
Admission: RE | Admit: 2012-02-25 | Discharge: 2012-02-25 | Disposition: A | Discharge status: Home / Self Care | Payer: BC Managed Care – PPO | Source: Ambulatory Visit | Attending: Obstetrics and Gynecology | Admitting: Obstetrics and Gynecology

## 2012-02-25 HISTORY — DX: Adverse effect of unspecified anesthetic, initial encounter: T41.45XA

## 2012-02-25 HISTORY — DX: Personal history of other diseases of the digestive system: Z87.19

## 2012-02-25 HISTORY — DX: Other specified postprocedural states: Z98.890

## 2012-02-25 HISTORY — DX: Other specified postprocedural states: R11.2

## 2012-02-25 HISTORY — DX: Hypothyroidism, unspecified: E03.9

## 2012-02-25 HISTORY — DX: Other complications of anesthesia, initial encounter: T88.59XA

## 2012-02-25 HISTORY — DX: Gastro-esophageal reflux disease without esophagitis: K21.9

## 2012-02-25 LAB — CBC
HCT: 37.8 % (ref 36.0–46.0)
Hemoglobin: 12.3 g/dL (ref 12.0–15.0)
MCV: 90.4 fL (ref 78.0–100.0)
RDW: 13.8 % (ref 11.5–15.5)
WBC: 5.5 10*3/uL (ref 4.0–10.5)

## 2012-02-25 LAB — SURGICAL PCR SCREEN
MRSA, PCR: NEGATIVE
Staphylococcus aureus: NEGATIVE

## 2012-02-25 NOTE — Patient Instructions (Signed)
Your procedure is scheduled on: 03/09/12  Enter through the Main Entrance at :1130 am Pick up desk phone and dial [phone] and inform us of your arrival.  Please call [phone] if you have any problems the morning of surgery.  Remember: Do not eat after midnight:Wed. Do not drink after:9am on Thursday  Take these meds the morning of surgery with a sip of water: thyroid med, IBS med  DO NOT wear jewelry, eye make-up, lipstick,body lotion, or dark fingernail polish. Do not shave for 48 hours prior to surgery.  If you are to be admitted after surgery, leave suitcase in car until your room has been assigned. Patients discharged on the day of surgery will not be allowed to drive home.   Remember to use your Hibiclens as instructed.

## 2012-02-29 ENCOUNTER — Telehealth: Payer: Self-pay | Admitting: Obstetrics and Gynecology

## 2012-02-29 NOTE — Telephone Encounter (Signed)
Robotic Assisted Hysterectomy: Oopherectomy scheduled for 03/09/12 @ 1:00 with SR/EP.  BCBS effective and pays 70/30 after a $ 3,5000 deductible. Pre-cert not required. Pre-op due $449.35. -Adrianne Pridgen

## 2012-02-29 NOTE — Telephone Encounter (Signed)
Robotic Assisted Hysterectomy scheduled for 03/09/12 @ 1:00 with SR/EP.  BCBS effective and pays 70/30 after a $ 3,5000 deductible. Pre-cert not required. Pre-op due $449.35. -Adrianne Pridgen

## 2012-03-01 ENCOUNTER — Other Ambulatory Visit: Payer: Self-pay | Admitting: Obstetrics and Gynecology

## 2012-03-01 ENCOUNTER — Encounter: Payer: Self-pay | Admitting: Obstetrics and Gynecology

## 2012-03-01 NOTE — Progress Notes (Unsigned)
  Following visit of 02/15/12, patient desires to proceed with robotic hysterectomy and RSO because of recurrence of PMB and family history of ovarian cancer. Will schedule and plan pre-op visit.

## 2012-03-02 ENCOUNTER — Encounter: Payer: Self-pay | Admitting: Obstetrics and Gynecology

## 2012-03-02 ENCOUNTER — Other Ambulatory Visit (HOSPITAL_COMMUNITY): Payer: BC Managed Care – PPO

## 2012-03-02 ENCOUNTER — Ambulatory Visit (INDEPENDENT_AMBULATORY_CARE_PROVIDER_SITE_OTHER): Payer: BC Managed Care – PPO | Admitting: Obstetrics and Gynecology

## 2012-03-02 VITALS — BP 110/70 | HR 76 | Temp 98.0°F | Resp 16 | Ht 63.5 in | Wt 244.0 lb

## 2012-03-02 DIAGNOSIS — Z01818 Encounter for other preprocedural examination: Secondary | ICD-10-CM
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
## 2012-03-07 ENCOUNTER — Telehealth: Payer: Self-pay | Admitting: Obstetrics and Gynecology

## 2012-03-07 NOTE — Telephone Encounter (Signed)
Pt calling requesting rx version of Miralax.  Pharmacist states insurance will cover rx and be cheaper for pt.  Will consult EP and /or SR.  Ld Ok to call in 8.3 oz Miralax no refills per EP.  Pt notified. ld

## 2012-03-08 MED ORDER — CEFOTETAN DISODIUM 2 G IJ SOLR
2.0000 g | INTRAMUSCULAR | Status: AC
  Administered 2012-03-09: 2 g via INTRAVENOUS
  Filled 2012-03-08: qty 2
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
## 2012-03-09 ENCOUNTER — Encounter (HOSPITAL_COMMUNITY): Payer: Self-pay | Admitting: *Deleted

## 2012-03-09 ENCOUNTER — Ambulatory Visit (HOSPITAL_COMMUNITY): Payer: BC Managed Care – PPO | Admitting: Anesthesiology

## 2012-03-09 ENCOUNTER — Ambulatory Visit (HOSPITAL_COMMUNITY)
Admission: RE | Admit: 2012-03-09 | Discharge: 2012-03-10 | Disposition: A | Discharge status: Home / Self Care | Payer: BC Managed Care – PPO | Source: Ambulatory Visit | Attending: Obstetrics and Gynecology | Admitting: Obstetrics and Gynecology

## 2012-03-09 ENCOUNTER — Encounter (HOSPITAL_COMMUNITY)
Admission: RE | Disposition: A | Discharge status: Home / Self Care | Payer: Self-pay | Source: Ambulatory Visit | Attending: Obstetrics and Gynecology

## 2012-03-09 ENCOUNTER — Encounter (HOSPITAL_COMMUNITY): Payer: Self-pay | Admitting: Anesthesiology

## 2012-03-09 ENCOUNTER — Other Ambulatory Visit: Payer: Self-pay | Admitting: Obstetrics and Gynecology

## 2012-03-09 DIAGNOSIS — N949 Unspecified condition associated with female genital organs and menstrual cycle: Secondary | ICD-10-CM

## 2012-03-09 DIAGNOSIS — N925 Other specified irregular menstruation: Secondary | ICD-10-CM

## 2012-03-09 DIAGNOSIS — N736 Female pelvic peritoneal adhesions (postinfective): Secondary | ICD-10-CM

## 2012-03-09 DIAGNOSIS — Z23 Encounter for immunization: Secondary | ICD-10-CM

## 2012-03-09 DIAGNOSIS — N938 Other specified abnormal uterine and vaginal bleeding: Secondary | ICD-10-CM | POA: Insufficient documentation

## 2012-03-09 DIAGNOSIS — Z9889 Other specified postprocedural states: Secondary | ICD-10-CM

## 2012-03-09 HISTORY — PX: SALPINGOOPHORECTOMY: SHX82

## 2012-03-09 HISTORY — PX: CYSTOSCOPY: SHX5120

## 2012-03-09 LAB — COMPREHENSIVE METABOLIC PANEL
ALT: 68 U/L — ABNORMAL HIGH (ref 0–35)
AST: 23 U/L (ref 0–37)
Alkaline Phosphatase: 108 U/L (ref 39–117)
CO2: 25 mEq/L (ref 19–32)
Chloride: 98 mEq/L (ref 96–112)
GFR calc Af Amer: >90 mL/min (ref >90)
GFR calc non Af Amer: >90 mL/min (ref >90)
Glucose, Bld: 91 mg/dL (ref 70–99)
Potassium: 4 mEq/L (ref 3.5–5.1)
Sodium: 135 mEq/L (ref 135–145)
Total Bilirubin: 0.4 mg/dL (ref 0.3–1.2)

## 2012-03-09 SURGERY — ROBOTIC ASSISTED TOTAL HYSTERECTOMY
Anesthesia: General | Site: Bladder | Laterality: Right | Wound class: Clean Contaminated

## 2012-03-09 MED ORDER — BUPIVACAINE HCL (PF) 0.25 % IJ SOLN
INTRAMUSCULAR | Status: DC | PRN
  Administered 2012-03-09: 18 mL

## 2012-03-09 MED ORDER — PNEUMOCOCCAL VAC POLYVALENT 25 MCG/0.5ML IJ INJ
0.5000 mL | INJECTION | INTRAMUSCULAR | Status: AC
Start: 2012-03-10 — End: 2012-03-09
  Administered 2012-03-09: 0.5 mL via INTRAMUSCULAR
  Filled 2012-03-09: qty 0.5

## 2012-03-09 MED ORDER — LACTATED RINGERS IR SOLN
Status: DC | PRN
  Administered 2012-03-09: 3000 mL

## 2012-03-09 MED ORDER — PHENYLEPHRINE HCL 10 MG/ML IJ SOLN: INTRAMUSCULAR | Status: DC | PRN

## 2012-03-09 MED ORDER — PROPOFOL 10 MG/ML IV EMUL
INTRAVENOUS | Status: AC
  Filled 2012-03-09: qty 20

## 2012-03-09 MED ORDER — ONDANSETRON HCL 4 MG PO TABS: 4.0000 mg | ORAL_TABLET | Freq: Three times a day (TID) | ORAL | Status: DC | PRN

## 2012-03-09 MED ORDER — ONDANSETRON HCL 4 MG/2ML IJ SOLN
INTRAMUSCULAR | Status: AC
  Filled 2012-03-09: qty 2

## 2012-03-09 MED ORDER — IBUPROFEN 600 MG PO TABS: 600.0000 mg | ORAL_TABLET | Freq: Four times a day (QID) | ORAL | Status: DC | PRN

## 2012-03-09 MED ORDER — INDIGOTINDISULFONATE SODIUM 8 MG/ML IJ SOLN
INTRAMUSCULAR | Status: DC | PRN
  Administered 2012-03-09: 5 mL via INTRAVENOUS

## 2012-03-09 MED ORDER — SCOPOLAMINE 1 MG/3DAYS TD PT72
MEDICATED_PATCH | TRANSDERMAL | Status: AC
  Administered 2012-03-09: 1.5 mg via TRANSDERMAL
  Filled 2012-03-09: qty 1

## 2012-03-09 MED ORDER — INFLUENZA VIRUS VACC SPLIT PF IM SUSP
0.5000 mL | INTRAMUSCULAR | Status: AC
  Administered 2012-03-09: 0.5 mL via INTRAMUSCULAR

## 2012-03-09 MED ORDER — MENTHOL 3 MG MT LOZG: 1.0000 | LOZENGE | OROMUCOSAL | Status: DC | PRN

## 2012-03-09 MED ORDER — KETOROLAC TROMETHAMINE 30 MG/ML IJ SOLN
30.0000 mg | Freq: Once | INTRAMUSCULAR | Status: AC
  Administered 2012-03-09: 30 mg via INTRAVENOUS

## 2012-03-09 MED ORDER — ROCURONIUM BROMIDE 100 MG/10ML IV SOLN
INTRAVENOUS | Status: DC | PRN
  Administered 2012-03-09: 10 mg via INTRAVENOUS
  Administered 2012-03-09: 30 mg via INTRAVENOUS
  Administered 2012-03-09: 10 mg via INTRAVENOUS
  Administered 2012-03-09: 40 mg via INTRAVENOUS
  Administered 2012-03-09: 10 mg via INTRAVENOUS

## 2012-03-09 MED ORDER — FENTANYL CITRATE 0.05 MG/ML IJ SOLN
INTRAMUSCULAR | Status: DC | PRN
  Administered 2012-03-09 (×3): 50 ug via INTRAVENOUS
  Administered 2012-03-09: 100 ug via INTRAVENOUS
  Administered 2012-03-09 (×2): 50 ug via INTRAVENOUS

## 2012-03-09 MED ORDER — LACTATED RINGERS IV SOLN
INTRAVENOUS | Status: DC
  Administered 2012-03-09 (×5): via INTRAVENOUS

## 2012-03-09 MED ORDER — ALBUTEROL SULFATE HFA 108 (90 BASE) MCG/ACT IN AERS: 2.0000 | INHALATION_SPRAY | Freq: Four times a day (QID) | RESPIRATORY_TRACT | Status: DC | PRN

## 2012-03-09 MED ORDER — ROCURONIUM BROMIDE 50 MG/5ML IV SOLN
INTRAVENOUS | Status: AC
  Filled 2012-03-09: qty 2

## 2012-03-09 MED ORDER — OXYCODONE-ACETAMINOPHEN 5-325 MG PO TABS: 1.0000 | ORAL_TABLET | ORAL | Status: DC | PRN

## 2012-03-09 MED ORDER — KETOROLAC TROMETHAMINE 30 MG/ML IJ SOLN
INTRAMUSCULAR | Status: AC
  Filled 2012-03-09: qty 1

## 2012-03-09 MED ORDER — LEVOTHYROXINE SODIUM 75 MCG PO TABS
75.0000 ug | ORAL_TABLET | Freq: Every day | ORAL | Status: DC
Start: 2012-03-10 — End: 2012-03-10
  Filled 2012-03-09: qty 1

## 2012-03-09 MED ORDER — FENTANYL CITRATE 0.05 MG/ML IJ SOLN
INTRAMUSCULAR | Status: AC
  Administered 2012-03-09: 50 ug via INTRAVENOUS
  Filled 2012-03-09: qty 2

## 2012-03-09 MED ORDER — MIDAZOLAM HCL 5 MG/5ML IJ SOLN
INTRAMUSCULAR | Status: DC | PRN
  Administered 2012-03-09: 2 mg via INTRAVENOUS

## 2012-03-09 MED ORDER — SCOPOLAMINE 1 MG/3DAYS TD PT72
1.0000 | MEDICATED_PATCH | Freq: Once | TRANSDERMAL | Status: DC
  Administered 2012-03-09: 1.5 mg via TRANSDERMAL

## 2012-03-09 MED ORDER — HYDROCODONE-ACETAMINOPHEN 5-325 MG PO TABS
1.0000 | ORAL_TABLET | ORAL | Status: DC | PRN
  Administered 2012-03-10: 1 via ORAL
  Filled 2012-03-09: qty 1

## 2012-03-09 MED ORDER — METOCLOPRAMIDE HCL 5 MG/ML IJ SOLN
INTRAMUSCULAR | Status: AC
  Administered 2012-03-09: 10 mg via INTRAVENOUS
  Filled 2012-03-09: qty 2

## 2012-03-09 MED ORDER — METOCLOPRAMIDE HCL 5 MG/ML IJ SOLN
10.0000 mg | Freq: Once | INTRAMUSCULAR | Status: AC | PRN
  Administered 2012-03-09: 10 mg via INTRAVENOUS

## 2012-03-09 MED ORDER — GLYCOPYRROLATE 0.2 MG/ML IJ SOLN
INTRAMUSCULAR | Status: AC
  Filled 2012-03-09: qty 4

## 2012-03-09 MED ORDER — NEOSTIGMINE METHYLSULFATE 1 MG/ML IJ SOLN
INTRAMUSCULAR | Status: DC | PRN
  Administered 2012-03-09: 3 mg via INTRAVENOUS

## 2012-03-09 MED ORDER — GLYCOPYRROLATE 0.2 MG/ML IJ SOLN
INTRAMUSCULAR | Status: DC | PRN
  Administered 2012-03-09: 0.6 mg via INTRAVENOUS

## 2012-03-09 MED ORDER — NEOSTIGMINE METHYLSULFATE 1 MG/ML IJ SOLN
INTRAMUSCULAR | Status: AC
  Filled 2012-03-09: qty 10

## 2012-03-09 MED ORDER — LACTATED RINGERS IV SOLN
INTRAVENOUS | Status: DC
  Administered 2012-03-09: 22:00:00 via INTRAVENOUS

## 2012-03-09 MED ORDER — IBUPROFEN 600 MG PO TABS: ORAL_TABLET | ORAL | Status: DC

## 2012-03-09 MED ORDER — LUBIPROSTONE 24 MCG PO CAPS
24.0000 ug | ORAL_CAPSULE | Freq: Two times a day (BID) | ORAL | Status: DC
  Administered 2012-03-09: 24 ug via ORAL
  Filled 2012-03-09 (×2): qty 1

## 2012-03-09 MED ORDER — PHENYLEPHRINE 40 MCG/ML (10ML) SYRINGE FOR IV PUSH (FOR BLOOD PRESSURE SUPPORT)
PREFILLED_SYRINGE | INTRAVENOUS | Status: AC
  Filled 2012-03-09: qty 5

## 2012-03-09 MED ORDER — MEPERIDINE HCL 25 MG/ML IJ SOLN: 6.2500 mg | INTRAMUSCULAR | Status: DC | PRN

## 2012-03-09 MED ORDER — FENTANYL CITRATE 0.05 MG/ML IJ SOLN
INTRAMUSCULAR | Status: AC
  Filled 2012-03-09: qty 2

## 2012-03-09 MED ORDER — MIDAZOLAM HCL 2 MG/2ML IJ SOLN
INTRAMUSCULAR | Status: AC
  Filled 2012-03-09: qty 2

## 2012-03-09 MED ORDER — LIDOCAINE HCL (CARDIAC) 20 MG/ML IV SOLN
INTRAVENOUS | Status: AC
  Filled 2012-03-09: qty 5

## 2012-03-09 MED ORDER — DEXAMETHASONE SODIUM PHOSPHATE 4 MG/ML IJ SOLN
INTRAMUSCULAR | Status: DC | PRN
  Administered 2012-03-09: 8 mg via INTRAVENOUS

## 2012-03-09 MED ORDER — DEXAMETHASONE SODIUM PHOSPHATE 10 MG/ML IJ SOLN
INTRAMUSCULAR | Status: AC
  Filled 2012-03-09: qty 1

## 2012-03-09 MED ORDER — LIDOCAINE HCL (CARDIAC) 20 MG/ML IV SOLN
INTRAVENOUS | Status: DC | PRN
  Administered 2012-03-09: 30 mg via INTRAVENOUS

## 2012-03-09 MED ORDER — ARTIFICIAL TEARS OP OINT
TOPICAL_OINTMENT | OPHTHALMIC | Status: AC
  Filled 2012-03-09: qty 3.5

## 2012-03-09 MED ORDER — LIDOCAINE-EPINEPHRINE (PF) 1 %-1:200000 IJ SOLN
INTRAMUSCULAR | Status: AC
  Filled 2012-03-09: qty 10

## 2012-03-09 MED ORDER — HYDROMORPHONE HCL PF 1 MG/ML IJ SOLN
INTRAMUSCULAR | Status: DC | PRN
  Administered 2012-03-09 (×2): 0.5 mg via INTRAVENOUS

## 2012-03-09 MED ORDER — FENTANYL CITRATE 0.05 MG/ML IJ SOLN
INTRAMUSCULAR | Status: AC
  Filled 2012-03-09: qty 5

## 2012-03-09 MED ORDER — PHENYLEPHRINE HCL 10 MG/ML IJ SOLN
INTRAMUSCULAR | Status: DC | PRN
  Administered 2012-03-09 (×7): 40 ug via INTRAVENOUS

## 2012-03-09 MED ORDER — FAMOTIDINE IN NACL 20-0.9 MG/50ML-% IV SOLN
20.0000 mg | Freq: Once | INTRAVENOUS | Status: AC
  Administered 2012-03-09: 20 mg via INTRAVENOUS
  Filled 2012-03-09: qty 50

## 2012-03-09 MED ORDER — ACETAMINOPHEN 10 MG/ML IV SOLN
1000.0000 mg | Freq: Four times a day (QID) | INTRAVENOUS | Status: DC
  Administered 2012-03-09: 1000 mg via INTRAVENOUS
  Filled 2012-03-09: qty 100

## 2012-03-09 MED ORDER — BUPIVACAINE HCL (PF) 0.25 % IJ SOLN
INTRAMUSCULAR | Status: AC
  Filled 2012-03-09: qty 30

## 2012-03-09 MED ORDER — FENTANYL CITRATE 0.05 MG/ML IJ SOLN
25.0000 ug | INTRAMUSCULAR | Status: DC | PRN
  Administered 2012-03-09 (×3): 50 ug via INTRAVENOUS

## 2012-03-09 MED ORDER — PROPOFOL 10 MG/ML IV EMUL
INTRAVENOUS | Status: DC | PRN
  Administered 2012-03-09: 200 mg via INTRAVENOUS

## 2012-03-09 MED ORDER — HYDROMORPHONE HCL PF 1 MG/ML IJ SOLN
INTRAMUSCULAR | Status: AC
  Filled 2012-03-09: qty 1

## 2012-03-09 SURGICAL SUPPLY — 47 items
ADH SKN CLS APL DERMABOND .7 (GAUZE/BANDAGES/DRESSINGS) ×4
BAG URINE DRAINAGE (UROLOGICAL SUPPLIES) ×5 IMPLANT
CABLE HIGH FREQUENCY MONO STRZ (ELECTRODE) ×4 IMPLANT
CATH FOLEY 3WAY  5CC 16FR (CATHETERS) ×1
CATH FOLEY 3WAY 5CC 16FR (CATHETERS) ×4 IMPLANT
CHLORAPREP W/TINT 26ML (MISCELLANEOUS) ×7 IMPLANT
CLOTH BEACON ORANGE TIMEOUT ST (SAFETY) ×5 IMPLANT
CONT PATH 16OZ SNAP LID 3702 (MISCELLANEOUS) ×5 IMPLANT
COVER TABLE BACK 60X90 (DRAPES) ×5 IMPLANT
COVER TIP SHEARS 8 DVNC (MISCELLANEOUS) ×2 IMPLANT
COVER TIP SHEARS 8MM DA VINCI (MISCELLANEOUS) ×2
DECANTER SPIKE VIAL GLASS SM (MISCELLANEOUS) IMPLANT
DERMABOND ADVANCED (GAUZE/BANDAGES/DRESSINGS) ×1
DERMABOND ADVANCED .7 DNX12 (GAUZE/BANDAGES/DRESSINGS) ×1 IMPLANT
DRAPE ROBOTICS STRL (DRAPES) ×2 IMPLANT
DRAPE WARM FLUID 44X44 (DRAPE) ×2 IMPLANT
ELECT REM PT RETURN 9FT ADLT (ELECTROSURGICAL) ×5
ELECTRODE REM PT RTRN 9FT ADLT (ELECTROSURGICAL) ×4 IMPLANT
GLOVE BIO SURGEON STRL SZ 6.5 (GLOVE) ×4 IMPLANT
GLOVE BIOGEL PI IND STRL 7.0 (GLOVE) ×8 IMPLANT
GLOVE BIOGEL PI INDICATOR 7.0 (GLOVE) ×2
GLOVE ECLIPSE 6.5 STRL STRAW (GLOVE) ×10 IMPLANT
GOWN STRL REIN XL XLG (GOWN DISPOSABLE) ×30 IMPLANT
KIT ACCESSORY DA VINCI DISP (KITS) ×1
KIT ACCESSORY DVNC DISP (KITS) ×4 IMPLANT
LEGGING LITHOTOMY PAIR STRL (DRAPES) ×2 IMPLANT
OCCLUDER COLPOPNEUMO (BALLOONS) ×4 IMPLANT
PACK LAVH (CUSTOM PROCEDURE TRAY) ×5 IMPLANT
PLUG CATH AND CAP STER (CATHETERS) ×5 IMPLANT
PROTECTOR NERVE ULNAR (MISCELLANEOUS) ×10 IMPLANT
SET CYSTO W/LG BORE CLAMP LF (SET/KITS/TRAYS/PACK) ×2 IMPLANT
SET IRRIG TUBING LAPAROSCOPIC (IRRIGATION / IRRIGATOR) ×2 IMPLANT
SOLUTION ELECTROLUBE (MISCELLANEOUS) ×2 IMPLANT
SUT VIC AB 0 CT1 27 (SUTURE) ×35
SUT VIC AB 0 CT1 27XBRD ANBCTR (SUTURE) ×8 IMPLANT
SUT VIC AB 0 CT1 27XBRD ANTBC (SUTURE) ×5 IMPLANT
SUT VICRYL 0 UR6 27IN ABS (SUTURE) ×10 IMPLANT
SUT VICRYL 4-0 PS2 18IN ABS (SUTURE) ×10 IMPLANT
SYSTEM CONVERTIBLE TROCAR (TROCAR) ×2 IMPLANT
TIP UTERINE 6.7X8CM BLUE DISP (MISCELLANEOUS) ×2 IMPLANT
TOWEL OR 17X24 6PK STRL BLUE (TOWEL DISPOSABLE) ×12 IMPLANT
TROCAR 12M 150ML BLUNT (TROCAR) ×2 IMPLANT
TROCAR DISP BLADELESS 8 DVNC (TROCAR) ×1 IMPLANT
TROCAR DISP BLADELESS 8MM (TROCAR) ×1
TROCAR XCEL NON-BLD 5MMX100MML (ENDOMECHANICALS) ×2 IMPLANT
WARMER LAPAROSCOPE (MISCELLANEOUS) ×2 IMPLANT
WATER STERILE IRR 1000ML POUR (IV SOLUTION) ×10 IMPLANT

## 2012-03-09 NOTE — Discharge Summary (Signed)
  Physician Discharge Summary  Patient ID: [name] MRN: [id number] DOB/AGE: May 13, 1961 51 y.o.  Admit date: 03/09/2012 Discharge date: 03/09/2012   Discharge Diagnoses: Dysfunctional Perimenopausal Bleeding,  Pelvic Pain and Abdominal/Pelvic Adhesions Active Problems:  * No active hospital problems. *    Operation:  Robot Assisted Total Laparoscopic Hysterectomy with Right Salpingo-oophorectomy, Lysis of Adhesions and Cystoscopy   Discharged Condition: Good  Hospital Course: On the date of admission, the patient underwent the aforementioned procedures, tolerating them well.  Post operative course was unremarkable with patient resuming bowel and bladder function on the day of surgery and therefore was deemed ready to discharge home.  Disposition: 01-Home or Self Care  Discharge Medications:   Ahmoni, Edge  Home Medication Instructions [id number]   Printed on:03/09/12 1811  Medication Information                    albuterol (PROVENTIL HFA;VENTOLIN HFA) 108 (90 BASE) MCG/ACT inhaler Inhale 2 puffs into the lungs every 6 (six) hours as needed. Shortness of breath             HYDROcodone-acetaminophen (NORCO) 5-325 MG per tablet Take 1 tablet by mouth every 6 (six) hours as needed. For pain            Probiotic Product (ALIGN PO) Take 1 capsule by mouth every morning.           Linaclotide (LINZESS) 290 MCG CAPS Take 1 capsule by mouth every morning.           esomeprazole (NEXIUM) 40 MG capsule Take 40 mg by mouth daily before breakfast.           levothyroxine (SYNTHROID, LEVOTHROID) 75 MCG tablet Take 75 mcg by mouth daily.           ibuprofen (ADVIL,MOTRIN) 600 MG tablet 1 po pc every 6 hours x 5 days then prn for pain           ondansetron (ZOFRAN) 4 MG tablet Take 1 tablet (4 mg total) by mouth every 8 (eight) hours as needed for nausea.               Follow-up:  Dr. Estanislado Pandy, March 23, 2012 at 10:50 a.m.   SignedHenreitta Leber, PA-C 03/09/2012,  6:11 PM

## 2012-03-09 NOTE — Preoperative (Signed)
Beta Blockers   Reason not to administer Beta Blockers:Not Applicable 

## 2012-03-09 NOTE — Transfer of Care (Signed)
Immediate Anesthesia Transfer of Care Note  Patient: [name]  Procedure(s) Performed: Procedure(s) (LRB) with comments: ROBOTIC ASSISTED TOTAL HYSTERECTOMY (N/A) SALPINGO OOPHERECTOMY (Right) LYSIS OF ADHESION (N/A) CYSTOSCOPY (N/A)  Patient Location: PACU  Anesthesia Type: General  Level of Consciousness: awake, alert , oriented and patient cooperative  Airway & Oxygen Therapy: Patient Spontanous Breathing and Patient connected to nasal cannula oxygen  Post-op Assessment: Report given to PACU RN and Post -op Vital signs reviewed and stable  Post vital signs: Reviewed and stable  Complications: No apparent anesthesia complications

## 2012-03-09 NOTE — Anesthesia Preprocedure Evaluation (Signed)
Anesthesia Evaluation  Patient identified by MRN, date of birth, ID band Patient awake    Reviewed: Allergy & Precautions, H&P , NPO status , Patient's Chart, lab work & pertinent test results  History of Anesthesia Complications (+) PONV  Airway Mallampati: III TM Distance: >3 FB Neck ROM: Full    Dental No notable dental hx. (+) Teeth Intact   Pulmonary asthma ,  breath sounds clear to auscultation  Pulmonary exam normal       Cardiovascular Rhythm:Regular Rate:Normal     Neuro/Psych negative neurological ROS  negative psych ROS   GI/Hepatic Neg liver ROS, GERD-  Medicated and Controlled,IBS   Endo/Other  Hypothyroidism Morbid obesity  Renal/GU negative Renal ROS  negative genitourinary   Musculoskeletal negative musculoskeletal ROS (+)   Abdominal (+) + obese,   Peds  Hematology negative hematology ROS (+)   Anesthesia Other Findings   Reproductive/Obstetrics negative OB ROS                           Anesthesia Physical Anesthesia Plan  ASA: III  Anesthesia Plan: General   Post-op Pain Management:    Induction: Intravenous  Airway Management Planned: Oral ETT  Additional Equipment:   Intra-op Plan:   Post-operative Plan: Extubation in OR  Informed Consent: I have reviewed the patients History and Physical, chart, labs and discussed the procedure including the risks, benefits and alternatives for the proposed anesthesia with the patient or authorized representative who has indicated his/her understanding and acceptance.   Dental advisory given  Plan Discussed with: CRNA, Anesthesiologist and Surgeon  Anesthesia Plan Comments:         Anesthesia Quick Evaluation

## 2012-03-09 NOTE — Interval H&P Note (Signed)
History and Physical Interval Note:  03/09/2012 1:01 PM  [name]  has presented today for surgery, with the diagnosis of Post Menopausal Bleeding  The various methods of treatment have been discussed with the patient and family. After consideration of risks, benefits and other options for treatment, the patient has consented to  Procedure(s) (LRB) with comments: ROBOTIC ASSISTED TOTAL HYSTERECTOMY WITH BILATERAL SALPINGO OOPHERECTOMY (Right) -   as a surgical intervention .  The patient's history has been reviewed, patient examined, no change in status, stable for surgery.  I have reviewed the patient's chart and labs.  Questions were answered to the patient's satisfaction.  Pt had mesh repair of umbilical hernia since last laparoscopy. Informed will try to go above mesh today.   [name] A

## 2012-03-09 NOTE — Op Note (Signed)
Preoperative diagnosis: Recurrent perimenopausal dysfunctional uterine bleeding  Postoperative diagnosis: same with abdominal adhesions  Anesthesia: Gen.  Anesthesiologist: Dr. Mal Amabile  Surgery: Robotically assisted total hysterectomy with right salpingo-oophorectomy and lysis of adhesions, cystoscopy  Surgeon: Dr. Dois Davenport Maynor Mwangi  Assistant: Henreitta Leber PA-C  Estimated blood loss: 125 cc  Procedure:  After being informed of the planned procedure with possible complications including but not limited to bleeding, infection, injury to other organs, need for laparotomy, surgical menopause, expected hospitals they in recovery, informed consent was obtained and patient was taken to or #7. She was given general anesthesia with endotracheal intubation without any complication. She was placed in the lithotomy position on a sticky mattress and beanbag with both arms padded and tucked on each side and knee-high sequential compressive devices. She was prepped and draped in the sterile fashion. A Foley catheter was inserted in her bladder. Pelvic exam reveals an anteverted uterus normal in size and shape and adnexa is not felt due to body habitus. A weighted speculum is inserted in the vagina and the anterior lip of the cervix is grasped with a tenaculum forcep. Uterus is sounded at 10 cm and the cervix was easily dilated using Hegar dilator until #27. This allows for easy placement of an intrauterine #8 RUMI manipulator with the 3.5 KOH ring and a vaginal occluder. The KOH ring is sutured to the cervix with 0 Vicryl.  We measure 20 cm x 15 cm a round the umbilicus to estimate the placement of the previous mesh when the patient underwent laparoscopic umbilical hernia repair. We measure 3 cm above the estimated superior margin and infiltrated the area with 8 cc of Marcaine 0.25. We perform a 10 mm vertical incision which is brought down bluntly to the fascia. The fascia is identified and grasped with  Coker forceps. The fascia is incised with Mayo scissors. Peritoneum is entered bluntly. A pursestring suture of 0 Vicryl is placed on the fascia and a 10 mm Hassan trocar is inserted in the abdominal cavity held in place by the pursestring suture. This allows for easy insufflation of the pneumoperitoneum using warmed CO2 at a maximum pressure of 15 mm mercury. We have cleared the mesh which is easily seen on the abdominal wall. We then placed a 8mm robotic trocar on the left, 8mm robotic trocar on the right and a 5 mm patient's side assistant trocar on the right all under direct visualization after infiltrating with Marcaine 0.25.  Observation: We note extensive adhesions between the omentum and the anterior abdominal wall covering most of the previously described mesh. Going around these adhesions we can see a normal anterior cul-de-sac, a slightly bulky uterus with no evidence of fibroids, and normal posterior cul-de-sac, normal right tube and ovary. We also see normal appendix with normal liver.  We then side dock the robot on the left and insert a PK gyrus forcep and arm # 2 and a monopolar scissor in arm #1. Preparation and docking takes 50 minutes. We proceed with systematic lysis of adhesion using monopolar scissors and bipolar cauterization until the whole omentum is freed from the anterior abdominal wall. Lysis of adhesion accounts for 35 minutes of consult time. We can now concentrate our attention towards the pelvis. We start on the right side by cauterizing the round ligament and sectioning it. We then cauterized the infundibulopelvic ligament and sectioned at always keeping the right ureter under direct visitation. The ureters course is easily identified. We can now cauterized and section the  mesosalpinx to join the round ligament which allows [place] to now addressed the broad ligament. The anterior sheath of the broad ligament is easily dissected with sharp scissors all the way across the lower uterine  segment. The posterior sheath of the broad ligament is easily dissected down to work the posterior KOH ring keeping the ureter under direct visitation. We then proceed with sharp and blunt dissection of the bladder all the way to 1 cm below the KOH ring. Proper dissection of the bladder as confirmed by filling the bladder with 200 cc of saline. The bladder is then is then emptied and we proceed with skeletonization of the vascular bundle on the right. With pressure on the KOH ring we can now cauterize the uterine artery and its ascending branch at the level of the KOH ring as well as the uterine vein. We moved to the left side where the round ligaments cauterized and section. This gives [place] access into the periuterine space by separating the anterior and posterior sheath of the broad ligament. We sharply dissected the anterior sheet to meet the previously placed anterior dissection to complete and the bladder dissection below the KOH ring. The left ureter is visualized in its upper portion below the level of the posterior sheath dissection which is brought to the KOH ring. We can now skeletonized the vascular bundle and cauterized the uterine artery in its ascending branch at the level of the KOH ring. Uterine veins are also cauterized. The vaginal occluder is inflated allowing [place] to perform a 360 colpotomy with a open monopolar scissors freeing the uterus entirely. The uterus and its right adnexa are delivered vaginally. Instruments are then modified for a suture cut in arm #1 and a long tip forcep and arm #2. 4-0 Vicryl suture are cut to 10 inches and are dropped in the abdominal cavity via the 10 mm Hassan trocar. All 4 needles are recuperated and attached to the right abdominal wall. One needle is used to start closing of the vaginal cuff with figure-of-eight stitches of 0 Vicryl. Using all 4 sutures we perform complete closure with hemostasis of the cuff. Needles are then again attached to the abdominal wall a  waiting retrievable. We proceed with profuse irrigation with warm saline and complete hemostasis on peritoneal edges using bipolar cauterization. Hemostasis was now deemed adequate. The right ureter is visualized and normal with normal peristaltic is him and note outpatient. The left ureter is more difficult to visualize but no dilated patient is noted.  Console time is 2 hours and 9 minutes.  The robot is undocked and we changed to a 5 mm camera in order to retrieve all 4 needles from the abdominal wall. This was performed without incident. To be noted a small hematoma at the area of needle placement is seen through the peritoneum on the right anterior abdominal wall. It measures approximately 4 cm x 4 cm and has remained completely stable through the procedure. No active bleeding is noted.  We then removed all trochars under direct position after evacuating the pneumoperitoneum. The fascia of the supraumbilical incision is closed with a Purstring suture of 0 Vicryl. The skin of all incision is closed with subcuticular suture of 3-0 Monocryl and Dermabond.  Indigo carmine is given by anesthesiology and we proceed with a postoperative cystoscopy to rapidly confirm an intact bladder and 2 normal ureteral jets. The bladder is drained. A vaginal speculum is inserted to confirm a adequate closure of the vaginal cuff with satisfactory hemostasis.  Instrument and sponge count is complete x2. Estimated blood loss is 125 cc. The procedure is very well tolerated by the patient is taken to recovery room in a well and stable condition.  Specimen: uterus with the right tube and right ovary weighing 169 g sent to pathology.

## 2012-03-09 NOTE — Anesthesia Postprocedure Evaluation (Signed)
Anesthesia Post Note  Patient: [name]  Procedure(s) Performed: Procedure(s) (LRB): ROBOTIC ASSISTED TOTAL HYSTERECTOMY (N/A) SALPINGO OOPHERECTOMY (Right) LYSIS OF ADHESION (N/A) CYSTOSCOPY (N/A)  Anesthesia type: GA  Patient location: PACU  Post pain: Pain level controlled  Post assessment: Post-op Vital signs reviewed  Last Vitals:  Filed Vitals:   03/09/12 2000  BP: 121/58  Pulse: 97  Temp: 36.9 C  Resp: 15    Post vital signs: Reviewed  Level of consciousness: sedated  Complications: No apparent anesthesia complications

## 2012-03-10 ENCOUNTER — Encounter (HOSPITAL_COMMUNITY): Payer: Self-pay | Admitting: Obstetrics and Gynecology

## 2012-03-10 NOTE — Progress Notes (Signed)
Pt discharged to home with family.  Left in wheelchair.  Teaching complete.

## 2012-03-14 ENCOUNTER — Telehealth: Payer: Self-pay | Admitting: Obstetrics and Gynecology

## 2012-03-14 NOTE — Telephone Encounter (Signed)
Pt taking Ibuprofen 600mg   q6 hours and Percocet q 4hours.  Pt states she is having severe back pain from surgery. Notified pt that unfortunately this can be a side effect from surgery.  Pt states she has tried all different positions with no relief.  meds aren't helping either.  Suggested that she try laying flat with legs elevated with or without heating pad.  Will send note to SR for review and instructions.  Pt to call this pm if worsening.  Pt agreeable.  Post op is 03-23-12.  ld

## 2012-03-14 NOTE — Telephone Encounter (Signed)
sr pt 

## 2012-03-23 ENCOUNTER — Encounter: Payer: Self-pay | Admitting: Obstetrics and Gynecology

## 2012-03-23 ENCOUNTER — Ambulatory Visit (INDEPENDENT_AMBULATORY_CARE_PROVIDER_SITE_OTHER): Payer: BC Managed Care – PPO | Admitting: Obstetrics and Gynecology

## 2012-03-23 VITALS — BP 106/60 | Temp 98.6°F | Ht 64.0 in | Wt 274.0 lb

## 2012-03-23 DIAGNOSIS — N949 Unspecified condition associated with female genital organs and menstrual cycle: Secondary | ICD-10-CM

## 2012-03-23 DIAGNOSIS — N938 Other specified abnormal uterine and vaginal bleeding: Secondary | ICD-10-CM

## 2012-03-23 NOTE — Progress Notes (Signed)
Surgery Robotic Hysterectomy and RSO   Date: 03/09/2012  Eating a regular diet without difficulty. Bowel movements are normal.  Pain is controlled with current analgesics. Medications being used: ibuprofen (OTC).  Bladder function is returned to normal. Vaginal bleeding: none Vaginal discharge: no vaginal discharge   Subjective:     [name] is a 51 y.o. female who presents for post-op visit.  Pathology report was reviewed with patient.  Diagnosis Uterus and cervix, with right tube and ovary - CERVIX: NABOTHIAN CYST. - ENDOMETRIUM: INACTIVE. NO EVIDENCE OR HYPERPLASIA OR CARCINOMA. - UTERINE SEROSA: FIBROUS ADHESIONS. NO ENDOMETRIOSIS OR EVIDENCE OF MALIGNANCY. - RIGHT FALLOPIAN TUBE: UNREMARKABLE. - RIGHT OVARY: BENIGN SEROUS CYST. NO ENDOMETRIOSIS OR EVIDENCE OF MALIGNANCY. Jimmy Picket MD Pathologist, Electronic Signature (Case signed 03/13/2012)  The following portions of the patient's history were reviewed and updated as appropriate: allergies, current medications, past family history, past medical history, past social history, past surgical history and problem list.  Review of Systems Pertinent items are noted in HPI.   Objective:    BP 106/60  Temp 98.6 F (37 C)  Ht 5\' 4"  (1.626 m)  Wt 274 lb (124.286 kg)  BMI 47.03 kg/m2  LMP 02/23/2012 Weight:  Wt Readings from Last 1 Encounters:  03/23/12 274 lb (124.286 kg)    BMI: Body mass index is 47.03 kg/(m^2).  General Appearance: Alert, appropriate appearance for age. No acute distress Lungs: clear to auscultation bilaterally Back: No CVA tenderness Cardiovascular: Regular rate and rhythm. S1, S2, no murmur Gastrointestinal: Soft, non-tender, no masses or organomegaly Incision/s: healing well Pelvic Exam: vaginal cuff healing well   Bimanual exam normal  Assessment:    Doing well postoperatively. Operative findings again reviewed.   Plan:   Follow-up 4 weeks fur cuff check  Silverio Lay  MD 11/7/20131:44 PM

## 2012-04-21 ENCOUNTER — Ambulatory Visit (INDEPENDENT_AMBULATORY_CARE_PROVIDER_SITE_OTHER): Payer: BC Managed Care – PPO | Admitting: Obstetrics and Gynecology

## 2012-04-21 ENCOUNTER — Encounter: Payer: Self-pay | Admitting: Obstetrics and Gynecology

## 2012-04-21 VITALS — BP 106/58 | Wt 276.0 lb

## 2012-04-21 DIAGNOSIS — N949 Unspecified condition associated with female genital organs and menstrual cycle: Secondary | ICD-10-CM

## 2012-04-21 DIAGNOSIS — N938 Other specified abnormal uterine and vaginal bleeding: Secondary | ICD-10-CM

## 2012-04-21 NOTE — Progress Notes (Signed)
Surgery: Hysterectomy   Date: 03/09/2012  Eating a regular diet with difficulty. Bowel movements are normal.  Pain is controlled with current analgesics. Medications being used: ibuprofen (OTC).  Bladder function is not returned to normal. Vaginal bleeding: none Vaginal discharge: no vaginal discharge Pt stated that if she doesn't wake up during the night to use the rest room she has bad pains in her stomach in the morning . Pt stated sometimes  her panties are wet due to not getting up.pt stated she uses the bathroom 2 times a night to use the bathroom .    Subjective:     [name] is a 51 y.o. female who presents for post-op visit.  The following portions of the patient's history were reviewed and updated as appropriate: allergies, current medications, past family history, past medical history, past social history, past surgical history and problem list.  Review of Systems Pertinent items are noted in HPI.   Objective:    BP 106/58  Wt 276 lb (125.193 kg)  LMP 02/23/2012 Weight:  Wt Readings from Last 1 Encounters:  04/21/12 276 lb (125.193 kg)    BMI: There is no height on file to calculate BMI.  General Appearance: Alert, appropriate appearance for age. No acute distress Lungs: clear to auscultation bilaterally Back: No CVA tenderness Cardiovascular: Regular rate and rhythm. S1, S2, no murmur Gastrointestinal: Soft, non-tender, no masses or organomegaly Incision/s: healing well Pelvic Exam: vaginal cuff healing well   Bimanual exam normal  Assessment:    Doing well postoperatively. Operative findings again reviewed.   Plan:   Normal post-op  AEX 1 year  Silverio Lay MD 12/6/201312:53 PM

## 2012-05-09 ENCOUNTER — Ambulatory Visit: Payer: Self-pay | Admitting: Obstetrics and Gynecology

## 2012-05-17 HISTORY — PX: MOHS SURGERY: SUR867

## 2012-06-08 ENCOUNTER — Ambulatory Visit: Payer: BC Managed Care – PPO | Admitting: Obstetrics and Gynecology

## 2012-08-24 ENCOUNTER — Other Ambulatory Visit: Payer: Self-pay

## 2012-08-24 DIAGNOSIS — Z1231 Encounter for screening mammogram for malignant neoplasm of breast: Secondary | ICD-10-CM

## 2012-10-02 ENCOUNTER — Other Ambulatory Visit: Payer: Self-pay | Admitting: Obstetrics and Gynecology

## 2012-10-02 ENCOUNTER — Other Ambulatory Visit: Payer: Self-pay

## 2012-10-02 ENCOUNTER — Ambulatory Visit
Admission: RE | Admit: 2012-10-02 | Discharge: 2012-10-02 | Disposition: A | Discharge status: Home / Self Care | Payer: BC Managed Care – PPO | Source: Ambulatory Visit

## 2012-10-02 DIAGNOSIS — N6310 Unspecified lump in the right breast, unspecified quadrant: Secondary | ICD-10-CM

## 2012-10-02 DIAGNOSIS — Z1231 Encounter for screening mammogram for malignant neoplasm of breast: Secondary | ICD-10-CM

## 2012-10-17 ENCOUNTER — Ambulatory Visit
Admission: RE | Admit: 2012-10-17 | Discharge: 2012-10-17 | Disposition: A | Discharge status: Home / Self Care | Payer: BC Managed Care – PPO | Source: Ambulatory Visit | Attending: Obstetrics and Gynecology | Admitting: Obstetrics and Gynecology

## 2012-10-17 ENCOUNTER — Other Ambulatory Visit: Payer: Self-pay | Admitting: Obstetrics and Gynecology

## 2012-10-17 ENCOUNTER — Ambulatory Visit
Admission: RE | Admit: 2012-10-17 | Discharge: 2012-10-17 | Disposition: A | Discharge status: Home / Self Care | Payer: BC Managed Care – PPO | Source: Ambulatory Visit

## 2012-10-17 DIAGNOSIS — N6310 Unspecified lump in the right breast, unspecified quadrant: Secondary | ICD-10-CM

## 2012-11-01 ENCOUNTER — Other Ambulatory Visit: Payer: Self-pay | Admitting: Obstetrics and Gynecology

## 2013-04-16 DIAGNOSIS — J189 Pneumonia, unspecified organism: Secondary | ICD-10-CM

## 2013-04-16 HISTORY — DX: Pneumonia, unspecified organism: J18.9

## 2013-07-05 ENCOUNTER — Encounter (INDEPENDENT_AMBULATORY_CARE_PROVIDER_SITE_OTHER): Payer: Self-pay | Admitting: Surgery

## 2013-07-17 ENCOUNTER — Ambulatory Visit (INDEPENDENT_AMBULATORY_CARE_PROVIDER_SITE_OTHER): Payer: BC Managed Care – PPO | Admitting: Surgery

## 2013-07-17 ENCOUNTER — Encounter (INDEPENDENT_AMBULATORY_CARE_PROVIDER_SITE_OTHER): Payer: Self-pay | Admitting: Surgery

## 2013-07-17 ENCOUNTER — Other Ambulatory Visit (INDEPENDENT_AMBULATORY_CARE_PROVIDER_SITE_OTHER): Payer: Self-pay | Admitting: Surgery

## 2013-07-17 VITALS — BP 117/78 | HR 76 | Temp 97.2°F | Resp 16 | Ht 64.0 in | Wt 288.2 lb

## 2013-07-17 DIAGNOSIS — K439 Ventral hernia without obstruction or gangrene: Secondary | ICD-10-CM

## 2013-07-17 DIAGNOSIS — K432 Incisional hernia without obstruction or gangrene: Secondary | ICD-10-CM

## 2013-07-17 DIAGNOSIS — R109 Unspecified abdominal pain: Secondary | ICD-10-CM

## 2013-07-17 NOTE — Progress Notes (Signed)
Patient ID: [name], female   DOB: [birth year], 53 y.o.   MRN: [id number]  Chief Complaint  Patient presents with  . Hernia    new pt- eval abd hernia    HPI [name] is a 53 y.o. female.   HPI This is a very pleasant female referred by Leonia Reader PA for evaluation of a possible recurrent incisional hernia. The patient has noticed an increasing bulge in her epigastric area for last several months. Prior to this and she's been having left upper quadrant abdominal pain. The pain hurts around to her back. She has had nausea. She is chronically constipated. The pain is cramping in nature with a dull ache. She has had no vomiting. Past Medical History  Diagnosis Date  . Asthma   . IBS (irritable bowel syndrome)   . Hypothyroidism   . GERD (gastroesophageal reflux disease)   . H/O constipation     current IBS med has relieved constipation  . Cancer     basal cell skin cancer  . Complication of anesthesia     asthma attack when waking up after intubation  . PONV (postoperative nausea and vomiting)     Past Surgical History  Procedure Laterality Date  . Carpal tunnel release    . Gallbladder surgery    . Shoulder open rotator cuff repair    . Oophorectomy    . Cholecystectomy    . Neck surgery    . Cardiac catheterization    . Salpingoophorectomy  03/09/2012    Procedure: SALPINGO OOPHERECTOMY;  Surgeon: [name], MD;  Location: [facility];  Service: Gynecology;  Laterality: Right;  . Cystoscopy  03/09/2012    Procedure: CYSTOSCOPY;  Surgeon: [name], MD;  Location: [facility];  Service: Gynecology;  Laterality: N/A;  . Robotic assisted total hysterectomy    . Abdominal hysterectomy    . Hernia repair      umb hernia/incisional    Family History  Problem Relation Age of Onset  . Diabetes Paternal Grandmother   . Diabetes Maternal Grandmother   . Diabetes Father   . Hypertension Father   . Breast cancer Mother   . Hypertension Mother   . Stroke Mother   .  Breast cancer Maternal Aunt   . Cancer Maternal Uncle     sarcoma    Social History History  Substance Use Topics  . Smoking status: Never Smoker   . Smokeless tobacco: Never Used  . Alcohol Use: No    No Known Allergies  Current Outpatient Prescriptions  Medication Sig Dispense Refill  . albuterol (PROVENTIL HFA;VENTOLIN HFA) 108 (90 BASE) MCG/ACT inhaler Inhale 2 puffs into the lungs every 6 (six) hours as needed. Shortness of breath        . esomeprazole (NEXIUM) 40 MG capsule Take 40 mg by mouth daily before breakfast.      . ibuprofen (ADVIL,MOTRIN) 600 MG tablet 1 po pc every 6 hours x 5 days then prn for pain  30 tablet  1  . levothyroxine (SYNTHROID, LEVOTHROID) 75 MCG tablet Take 75 mcg by mouth daily.       No current facility-administered medications for this visit.    Review of Systems Review of Systems  Constitutional: Negative for fever, chills and unexpected weight change.  HENT: Negative for congestion, hearing loss, sore throat, trouble swallowing and voice change.   Eyes: Negative for visual disturbance.  Respiratory: Negative for cough and wheezing.   Cardiovascular: Negative for chest  pain, palpitations and leg swelling.  Gastrointestinal: Positive for nausea, abdominal pain and constipation. Negative for vomiting, diarrhea, blood in stool, abdominal distention and anal bleeding.  Genitourinary: Negative for hematuria, vaginal bleeding and difficulty urinating.  Musculoskeletal: Negative for arthralgias.  Skin: Negative for rash and wound.  Neurological: Negative for seizures, syncope and headaches.  Hematological: Negative for adenopathy. Does not bruise/bleed easily.  Psychiatric/Behavioral: Negative for confusion.    Blood pressure 117/78, pulse 76, temperature 97.2 F (36.2 C), temperature source Oral, resp. rate 16, height 5\' 4"  (1.626 m), weight 288 lb 3.2 oz (130.727 kg), last menstrual period 02/23/2012.  Physical Exam Physical Exam   Constitutional: She is oriented to person, place, and time. She appears well-developed. No distress.  Morbidly obese  HENT:  Head: Normocephalic and atraumatic.  Right Ear: External ear normal.  Left Ear: External ear normal.  Nose: Nose normal.  Mouth/Throat: Oropharynx is clear and moist. No oropharyngeal exudate.  Eyes: Conjunctivae are normal. Pupils are equal, round, and reactive to light. Right eye exhibits no discharge. Left eye exhibits no discharge. No scleral icterus.  Neck: Normal range of motion. Neck supple. No tracheal deviation present.  Cardiovascular: Normal rate, regular rhythm, normal heart sounds and intact distal pulses.   No murmur heard. Pulmonary/Chest: Effort normal and breath sounds normal. She has no wheezes. She has no rales.  Abdominal: Soft. Bowel sounds are normal. There is tenderness.  She has a reducible hernia in her upper abdomen that appears above the previous repair. She has tenderness in the left upper quadrant  Musculoskeletal: Normal range of motion. She exhibits no edema and no tenderness.  Lymphadenopathy:    She has no cervical adenopathy.  Neurological: She is alert and oriented to person, place, and time.  Skin: Skin is warm and dry. No rash noted. She is not diaphoretic. No erythema.  Psychiatric: Her behavior is normal.    Data Reviewed   Assessment    Recurrent incisional hernia as well as left sided abdominal pain and tenderness     Plan    She does have a recurrent hernia but it is difficult to tell if some of her symptoms are related to a hernia or not. I believe she needs a preoperative CAT scan of her abdomen and pelvis to see how much bowel may be involved in the hernia given her previous mesh as well as to determine whether there is some other etiology of the left-sided and left flank discomfort preoperatively. I will call her back with results of the CAT scan and we will then determine how to proceed         Xabi Wittler A 07/17/2013, 10:38 AM

## 2013-07-18 ENCOUNTER — Other Ambulatory Visit (INDEPENDENT_AMBULATORY_CARE_PROVIDER_SITE_OTHER): Payer: Self-pay | Admitting: Surgery

## 2013-07-18 ENCOUNTER — Telehealth (INDEPENDENT_AMBULATORY_CARE_PROVIDER_SITE_OTHER): Payer: Self-pay | Admitting: *Deleted

## 2013-07-18 NOTE — Telephone Encounter (Signed)
Thanks I will send message to schedulers

## 2013-07-18 NOTE — Telephone Encounter (Signed)
Patient called in and stated that her insurance will not pay for the CT scan so she wants to proceed with surgery and not have the CT scan done.  Please address.

## 2013-07-20 ENCOUNTER — Telehealth (INDEPENDENT_AMBULATORY_CARE_PROVIDER_SITE_OTHER): Payer: Self-pay | Admitting: Surgery

## 2013-07-20 ENCOUNTER — Other Ambulatory Visit: Payer: BC Managed Care – PPO

## 2013-07-20 NOTE — Telephone Encounter (Signed)
Pt called in / no longer wants CT scan too expensive / would now just like to proceed with hernia surgery

## 2013-07-31 ENCOUNTER — Encounter (HOSPITAL_COMMUNITY): Payer: Self-pay | Admitting: Pharmacy Technician

## 2013-08-02 NOTE — Pre-Procedure Instructions (Signed)
[name]  08/02/2013   Your procedure is scheduled on:  Thurs, Mar 26 @ 10:20 AM  Report to [facility]  at 7:15 AM.  Call this number if you have problems the morning of surgery: [phone]   Remember:   Do not eat food or drink liquids after midnight.   Take these medicines the morning of surgery with A SIP OF WATER: Albuterol<Bring Your Inhaler With You>,Nexium(Esomeprazole),and Synthroid(Levothyroxine)              Stop taking your Sudafed. No Goody's,BC's,Aleve,Aspirin,Ibuprofen,Fish Oil,or any Herbal Medications   Do not wear jewelry, make-up or nail polish.  Do not wear lotions, powders, or perfumes. You may wear deodorant.  Do not shave 48 hours prior to surgery.   Do not bring valuables to the hospital.  [facility] is not responsible                  for any belongings or valuables.               Contacts, dentures or bridgework may not be worn into surgery.  Leave suitcase in the car. After surgery it may be brought to your room.  For patients admitted to the hospital, discharge time is determined by your                treatment team.               Patients discharged the day of surgery will not be allowed to drive  home.    Special Instructions:  [facility] - Preparing for Surgery  Before surgery, you can play an important role.  Because skin is not sterile, your skin needs to be as free of germs as possible.  You can reduce the number of germs on you skin by washing with CHG (chlorahexidine gluconate) soap before surgery.  CHG is an antiseptic cleaner which kills germs and bonds with the skin to continue killing germs even after washing.  Please DO NOT use if you have an allergy to CHG or antibacterial soaps.  If your skin becomes reddened/irritated stop using the CHG and inform your nurse when you arrive at Short Stay.  Do not shave (including legs and underarms) for at least 48 hours prior to the first CHG shower.  You may shave your face.  Please  follow these instructions carefully:   1.  Shower with CHG Soap the night before surgery and the                                morning of Surgery.  2.  If you choose to wash your hair, wash your hair first as usual with your       normal shampoo.  3.  After you shampoo, rinse your hair and body thoroughly to remove the                      Shampoo.  4.  Use CHG as you would any other liquid soap.  You can apply chg directly       to the skin and wash gently with scrungie or a clean washcloth.  5.  Apply the CHG Soap to your body ONLY FROM THE NECK DOWN.        Do not use on open wounds or open sores.  Avoid contact with your eyes,  ears, mouth and genitals (private parts).  Wash genitals (private parts)       with your normal soap.  6.  Wash thoroughly, paying special attention to the area where your surgery        will be performed.  7.  Thoroughly rinse your body with warm water from the neck down.  8.  DO NOT shower/wash with your normal soap after using and rinsing off       the CHG Soap.  9.  Pat yourself dry with a clean towel.            10.  Wear clean pajamas.            11.  Place clean sheets on your bed the night of your first shower and do not        sleep with pets.  Day of Surgery  Do not apply any lotions/deoderants the morning of surgery.  Please wear clean clothes to the hospital/surgery center.     Please read over the following fact sheets that you were given: Pain Booklet, Coughing and Deep Breathing and Surgical Site Infection Prevention

## 2013-08-03 ENCOUNTER — Encounter (HOSPITAL_COMMUNITY): Payer: Self-pay

## 2013-08-03 ENCOUNTER — Encounter (HOSPITAL_COMMUNITY)
Admission: RE | Admit: 2013-08-03 | Discharge: 2013-08-03 | Disposition: A | Discharge status: Home / Self Care | Payer: BC Managed Care – PPO | Source: Ambulatory Visit | Attending: Surgery | Admitting: Surgery

## 2013-08-03 ENCOUNTER — Inpatient Hospital Stay (HOSPITAL_COMMUNITY)
Admission: RE | Admit: 2013-08-03 | Discharge: 2013-08-03 | Disposition: A | Discharge status: Home / Self Care | Payer: BC Managed Care – PPO | Source: Ambulatory Visit

## 2013-08-03 DIAGNOSIS — K219 Gastro-esophageal reflux disease without esophagitis: Secondary | ICD-10-CM | POA: Diagnosis not present

## 2013-08-03 DIAGNOSIS — Z01812 Encounter for preprocedural laboratory examination: Secondary | ICD-10-CM | POA: Insufficient documentation

## 2013-08-03 DIAGNOSIS — E039 Hypothyroidism, unspecified: Secondary | ICD-10-CM | POA: Diagnosis not present

## 2013-08-03 DIAGNOSIS — J45909 Unspecified asthma, uncomplicated: Secondary | ICD-10-CM | POA: Diagnosis not present

## 2013-08-03 DIAGNOSIS — K589 Irritable bowel syndrome without diarrhea: Secondary | ICD-10-CM | POA: Diagnosis not present

## 2013-08-03 DIAGNOSIS — K432 Incisional hernia without obstruction or gangrene: Secondary | ICD-10-CM | POA: Diagnosis present

## 2013-08-03 HISTORY — DX: Pneumonia, unspecified organism: J18.9

## 2013-08-03 HISTORY — DX: Other seasonal allergic rhinitis: J30.2

## 2013-08-03 LAB — BASIC METABOLIC PANEL
BUN: 10 mg/dL (ref 6–23)
CALCIUM: 9.3 mg/dL (ref 8.4–10.5)
CO2: 26 meq/L (ref 19–32)
CREATININE: 0.74 mg/dL (ref 0.50–1.10)
Chloride: 103 mEq/L (ref 96–112)
GFR calc Af Amer: >90 mL/min (ref >90)
GFR calc non Af Amer: >90 mL/min (ref >90)
Glucose, Bld: 99 mg/dL (ref 70–99)
Potassium: 4.4 mEq/L (ref 3.7–5.3)
Sodium: 141 mEq/L (ref 137–147)

## 2013-08-03 LAB — CBC
HEMATOCRIT: 40.6 % (ref 36.0–46.0)
HEMOGLOBIN: 13.5 g/dL (ref 12.0–15.0)
MCH: 29.3 pg (ref 26.0–34.0)
MCHC: 33.3 g/dL (ref 30.0–36.0)
MCV: 88.3 fL (ref 78.0–100.0)
Platelets: 243 10*3/uL (ref 150–400)
RBC: 4.6 MIL/uL (ref 3.87–5.11)
RDW: 14.3 % (ref 11.5–15.5)
WBC: 5.6 10*3/uL (ref 4.0–10.5)

## 2013-08-08 MED ORDER — DEXTROSE 5 % IV SOLN
3.0000 g | INTRAVENOUS | Status: AC
  Administered 2013-08-09: 3 g via INTRAVENOUS
  Filled 2013-08-08: qty 3000

## 2013-08-08 NOTE — H&P (Signed)
Patient ID: [name], female DOB: 1960/10/16, 53 y.o. MRN: [id number]  Chief Complaint   Patient presents with   .  Hernia     new pt- eval abd hernia   HPI  [name] is a 53 y.o. female.  HPI  This is a very pleasant female referred by Leonia Reader PA for evaluation of a possible recurrent incisional hernia. The patient has noticed an increasing bulge in her epigastric area for last several months. Prior to this and she's been having left upper quadrant abdominal pain. The pain hurts around to her back. She has had nausea. She is chronically constipated. The pain is cramping in nature with a dull ache. She has had no vomiting.  Past Medical History   Diagnosis  Date   .  Asthma    .  IBS (irritable bowel syndrome)    .  Hypothyroidism    .  GERD (gastroesophageal reflux disease)    .  H/O constipation      current IBS med has relieved constipation   .  Cancer      basal cell skin cancer   .  Complication of anesthesia      asthma attack when waking up after intubation   .  PONV (postoperative nausea and vomiting)     Past Surgical History   Procedure  Laterality  Date   .  Carpal tunnel release     .  Gallbladder surgery     .  Shoulder open rotator cuff repair     .  Oophorectomy     .  Cholecystectomy     .  Neck surgery     .  Cardiac catheterization     .  Salpingoophorectomy   03/09/2012     Procedure: SALPINGO OOPHERECTOMY; Surgeon: [name], MD; Location: [facility]; Service: Gynecology; Laterality: Right;   .  Cystoscopy   03/09/2012     Procedure: CYSTOSCOPY; Surgeon: [name], MD; Location: [facility]; Service: Gynecology; Laterality: N/A;   .  Robotic assisted total hysterectomy     .  Abdominal hysterectomy     .  Hernia repair       umb hernia/incisional    Family History   Problem  Relation  Age of Onset   .  Diabetes  Paternal Grandmother    .  Diabetes  Maternal Grandmother    .  Diabetes  Father    .  Hypertension  Father    .  Breast  cancer  Mother    .  Hypertension  Mother    .  Stroke  Mother    .  Breast cancer  Maternal Aunt    .  Cancer  Maternal Uncle      sarcoma   Social History  History   Substance Use Topics   .  Smoking status:  Never Smoker   .  Smokeless tobacco:  Never Used   .  Alcohol Use:  No   No Known Allergies  Current Outpatient Prescriptions   Medication  Sig  Dispense  Refill   .  albuterol (PROVENTIL HFA;VENTOLIN HFA) 108 (90 BASE) MCG/ACT inhaler  Inhale 2 puffs into the lungs every 6 (six) hours as needed. Shortness of breath     .  esomeprazole (NEXIUM) 40 MG capsule  Take 40 mg by mouth daily before breakfast.     .  ibuprofen (ADVIL,MOTRIN) 600 MG tablet  1 po pc every 6 hours  x 5 days then prn for pain  30 tablet  1   .  levothyroxine (SYNTHROID, LEVOTHROID) 75 MCG tablet  Take 75 mcg by mouth daily.      No current facility-administered medications for this visit.   Review of Systems  Review of Systems  Constitutional: Negative for fever, chills and unexpected weight change.  HENT: Negative for congestion, hearing loss, sore throat, trouble swallowing and voice change.  Eyes: Negative for visual disturbance.  Respiratory: Negative for cough and wheezing.  Cardiovascular: Negative for chest pain, palpitations and leg swelling.  Gastrointestinal: Positive for nausea, abdominal pain and constipation. Negative for vomiting, diarrhea, blood in stool, abdominal distention and anal bleeding.  Genitourinary: Negative for hematuria, vaginal bleeding and difficulty urinating.  Musculoskeletal: Negative for arthralgias.  Skin: Negative for rash and wound.  Neurological: Negative for seizures, syncope and headaches.  Hematological: Negative for adenopathy. Does not bruise/bleed easily.  Psychiatric/Behavioral: Negative for confusion.  Blood pressure 117/78, pulse 76, temperature 97.2 F (36.2 C), temperature source Oral, resp. rate 16, height 5\' 4"  (1.626 m), weight 288 lb 3.2 oz (130.727  kg), last menstrual period 02/23/2012.  Physical Exam  Physical Exam  Constitutional: She is oriented to person, place, and time. She appears well-developed. No distress.  Morbidly obese  HENT:  Head: Normocephalic and atraumatic.  Right Ear: External ear normal.  Left Ear: External ear normal.  Nose: Nose normal.  Mouth/Throat: Oropharynx is clear and moist. No oropharyngeal exudate.  Eyes: Conjunctivae are normal. Pupils are equal, round, and reactive to light. Right eye exhibits no discharge. Left eye exhibits no discharge. No scleral icterus.  Neck: Normal range of motion. Neck supple. No tracheal deviation present.  Cardiovascular: Normal rate, regular rhythm, normal heart sounds and intact distal pulses.  No murmur heard.  Pulmonary/Chest: Effort normal and breath sounds normal. She has no wheezes. She has no rales.  Abdominal: Soft. Bowel sounds are normal. There is tenderness.  She has a reducible hernia in her upper abdomen that appears above the previous repair. She has tenderness in the left upper quadrant  Musculoskeletal: Normal range of motion. She exhibits no edema and no tenderness.  Lymphadenopathy:  She has no cervical adenopathy.  Neurological: She is alert and oriented to person, place, and time.  Skin: Skin is warm and dry. No rash noted. She is not diaphoretic. No erythema.  Psychiatric: Her behavior is normal.  Data Reviewed  Assessment  Recurrent incisional hernia as well as left sided abdominal pain and tenderness  Plan   Plan laparoscopic incisional hernia repair with mesh.  I discussed the risks which include but is not limited to bleeding, infection, injury to surrounding structures including the bowel, need to convert to an open procedure, DVT, etc.  We also discussed post op recovery.  She agrees to proceed.

## 2013-08-09 ENCOUNTER — Encounter (HOSPITAL_COMMUNITY): Payer: Self-pay | Admitting: *Deleted

## 2013-08-09 ENCOUNTER — Ambulatory Visit (HOSPITAL_COMMUNITY)
Admission: RE | Admit: 2013-08-09 | Discharge: 2013-08-10 | Disposition: A | Discharge status: Home / Self Care | Payer: BC Managed Care – PPO | Source: Ambulatory Visit | Attending: Surgery | Admitting: Surgery

## 2013-08-09 ENCOUNTER — Encounter (HOSPITAL_COMMUNITY)
Admission: RE | Disposition: A | Discharge status: Home / Self Care | Payer: Self-pay | Source: Ambulatory Visit | Attending: Surgery

## 2013-08-09 ENCOUNTER — Encounter (HOSPITAL_COMMUNITY): Payer: BC Managed Care – PPO | Admitting: Anesthesiology

## 2013-08-09 ENCOUNTER — Ambulatory Visit (HOSPITAL_COMMUNITY): Payer: BC Managed Care – PPO | Admitting: Anesthesiology

## 2013-08-09 DIAGNOSIS — K432 Incisional hernia without obstruction or gangrene: Secondary | ICD-10-CM | POA: Diagnosis present

## 2013-08-09 DIAGNOSIS — K589 Irritable bowel syndrome without diarrhea: Secondary | ICD-10-CM | POA: Insufficient documentation

## 2013-08-09 DIAGNOSIS — K219 Gastro-esophageal reflux disease without esophagitis: Secondary | ICD-10-CM | POA: Insufficient documentation

## 2013-08-09 DIAGNOSIS — E039 Hypothyroidism, unspecified: Secondary | ICD-10-CM | POA: Insufficient documentation

## 2013-08-09 DIAGNOSIS — J45909 Unspecified asthma, uncomplicated: Secondary | ICD-10-CM | POA: Insufficient documentation

## 2013-08-09 DIAGNOSIS — Z01812 Encounter for preprocedural laboratory examination: Secondary | ICD-10-CM | POA: Insufficient documentation

## 2013-08-09 HISTORY — PX: INSERTION OF MESH: SHX5868

## 2013-08-09 HISTORY — DX: Family history of other specified conditions: Z84.89

## 2013-08-09 HISTORY — DX: Basal cell carcinoma of skin of nose: C44.311

## 2013-08-09 HISTORY — PX: INCISIONAL HERNIA REPAIR: SHX193

## 2013-08-09 SURGERY — REPAIR, HERNIA, INCISIONAL, LAPAROSCOPIC
Anesthesia: General

## 2013-08-09 MED ORDER — ESMOLOL HCL 10 MG/ML IV SOLN
INTRAVENOUS | Status: AC
  Filled 2013-08-09: qty 10

## 2013-08-09 MED ORDER — MEPERIDINE HCL 25 MG/ML IJ SOLN: 6.2500 mg | INTRAMUSCULAR | Status: DC | PRN

## 2013-08-09 MED ORDER — ROCURONIUM BROMIDE 50 MG/5ML IV SOLN
INTRAVENOUS | Status: AC
Start: 2013-08-09 — End: 2013-08-09
  Filled 2013-08-09: qty 1

## 2013-08-09 MED ORDER — BUPIVACAINE-EPINEPHRINE (PF) 0.25% -1:200000 IJ SOLN
INTRAMUSCULAR | Status: AC
Start: 2013-08-09 — End: 2013-08-09
  Filled 2013-08-09: qty 30

## 2013-08-09 MED ORDER — HYDROMORPHONE HCL PF 1 MG/ML IJ SOLN
INTRAMUSCULAR | Status: AC
  Filled 2013-08-09: qty 1

## 2013-08-09 MED ORDER — LACTATED RINGERS IV SOLN
INTRAVENOUS | Status: DC | PRN
  Administered 2013-08-09 (×2): via INTRAVENOUS

## 2013-08-09 MED ORDER — DOCUSATE SODIUM 100 MG PO CAPS
100.0000 mg | ORAL_CAPSULE | Freq: Every day | ORAL | Status: DC
  Administered 2013-08-10: 100 mg via ORAL
  Filled 2013-08-09: qty 1

## 2013-08-09 MED ORDER — LIDOCAINE HCL (CARDIAC) 20 MG/ML IV SOLN
INTRAVENOUS | Status: AC
  Filled 2013-08-09: qty 5

## 2013-08-09 MED ORDER — ONDANSETRON HCL 4 MG/2ML IJ SOLN
4.0000 mg | Freq: Four times a day (QID) | INTRAMUSCULAR | Status: DC | PRN
  Administered 2013-08-09 – 2013-08-10 (×2): 4 mg via INTRAVENOUS
  Filled 2013-08-09 (×2): qty 2

## 2013-08-09 MED ORDER — NEOSTIGMINE METHYLSULFATE 1 MG/ML IJ SOLN
INTRAMUSCULAR | Status: DC | PRN
  Administered 2013-08-09: 5 mg via INTRAVENOUS

## 2013-08-09 MED ORDER — BUPIVACAINE-EPINEPHRINE 0.25% -1:200000 IJ SOLN
INTRAMUSCULAR | Status: DC | PRN
  Administered 2013-08-09: 20 mL

## 2013-08-09 MED ORDER — PROPOFOL 10 MG/ML IV BOLUS
INTRAVENOUS | Status: DC | PRN
  Administered 2013-08-09: 120 mg via INTRAVENOUS

## 2013-08-09 MED ORDER — IBUPROFEN 600 MG PO TABS: 600.0000 mg | ORAL_TABLET | Freq: Four times a day (QID) | ORAL | Status: DC | PRN

## 2013-08-09 MED ORDER — NEOSTIGMINE METHYLSULFATE 1 MG/ML IJ SOLN
INTRAMUSCULAR | Status: AC
  Filled 2013-08-09: qty 10

## 2013-08-09 MED ORDER — FENTANYL CITRATE 0.05 MG/ML IJ SOLN
INTRAMUSCULAR | Status: AC
  Filled 2013-08-09: qty 5

## 2013-08-09 MED ORDER — ENOXAPARIN SODIUM 40 MG/0.4ML ~~LOC~~ SOLN
40.0000 mg | SUBCUTANEOUS | Status: DC
  Filled 2013-08-09: qty 0.4

## 2013-08-09 MED ORDER — ALBUTEROL SULFATE (2.5 MG/3ML) 0.083% IN NEBU: 2.5000 mg | INHALATION_SOLUTION | Freq: Four times a day (QID) | RESPIRATORY_TRACT | Status: DC | PRN

## 2013-08-09 MED ORDER — OXYCODONE HCL 5 MG/5ML PO SOLN: 5.0000 mg | Freq: Once | ORAL | Status: AC | PRN

## 2013-08-09 MED ORDER — OXYCODONE HCL 5 MG PO TABS
ORAL_TABLET | ORAL | Status: AC
  Administered 2013-08-09: 5 mg
  Filled 2013-08-09: qty 1

## 2013-08-09 MED ORDER — SCOPOLAMINE 1 MG/3DAYS TD PT72
MEDICATED_PATCH | TRANSDERMAL | Status: AC
  Administered 2013-08-09: 1.5 mg
  Filled 2013-08-09: qty 1

## 2013-08-09 MED ORDER — HYDROMORPHONE HCL PF 1 MG/ML IJ SOLN
0.2500 mg | INTRAMUSCULAR | Status: DC | PRN
  Administered 2013-08-09 (×2): 0.5 mg via INTRAVENOUS

## 2013-08-09 MED ORDER — FENTANYL CITRATE 0.05 MG/ML IJ SOLN
INTRAMUSCULAR | Status: DC | PRN
  Administered 2013-08-09: 150 ug via INTRAVENOUS

## 2013-08-09 MED ORDER — POTASSIUM CHLORIDE IN NACL 20-0.9 MEQ/L-% IV SOLN
INTRAVENOUS | Status: DC
  Administered 2013-08-09: 18:00:00 via INTRAVENOUS
  Filled 2013-08-09 (×4): qty 1000

## 2013-08-09 MED ORDER — OXYCODONE-ACETAMINOPHEN 5-325 MG PO TABS
1.0000 | ORAL_TABLET | ORAL | Status: DC | PRN
  Administered 2013-08-10: 2 via ORAL
  Filled 2013-08-09 (×2): qty 2

## 2013-08-09 MED ORDER — MIDAZOLAM HCL 2 MG/2ML IJ SOLN
INTRAMUSCULAR | Status: AC
  Administered 2013-08-09: 1 mg
  Filled 2013-08-09: qty 2

## 2013-08-09 MED ORDER — ONDANSETRON HCL 4 MG/2ML IJ SOLN
INTRAMUSCULAR | Status: AC
  Filled 2013-08-09: qty 2

## 2013-08-09 MED ORDER — ROCURONIUM BROMIDE 100 MG/10ML IV SOLN
INTRAVENOUS | Status: DC | PRN
  Administered 2013-08-09: 20 mg via INTRAVENOUS

## 2013-08-09 MED ORDER — ONDANSETRON HCL 4 MG/2ML IJ SOLN
INTRAMUSCULAR | Status: DC | PRN
  Administered 2013-08-09: 4 mg via INTRAVENOUS

## 2013-08-09 MED ORDER — OXYCODONE HCL 5 MG PO TABS
5.0000 mg | ORAL_TABLET | Freq: Once | ORAL | Status: AC | PRN
  Administered 2013-08-09: 5 mg via ORAL

## 2013-08-09 MED ORDER — ONDANSETRON HCL 4 MG/2ML IJ SOLN
4.0000 mg | Freq: Once | INTRAMUSCULAR | Status: AC | PRN
  Administered 2013-08-09: 4 mg via INTRAVENOUS

## 2013-08-09 MED ORDER — 0.9 % SODIUM CHLORIDE (POUR BTL) OPTIME
TOPICAL | Status: DC | PRN
  Administered 2013-08-09: 1000 mL

## 2013-08-09 MED ORDER — DEXAMETHASONE SODIUM PHOSPHATE 4 MG/ML IJ SOLN
INTRAMUSCULAR | Status: AC
  Filled 2013-08-09: qty 2

## 2013-08-09 MED ORDER — GLYCOPYRROLATE 0.2 MG/ML IJ SOLN
INTRAMUSCULAR | Status: DC | PRN
  Administered 2013-08-09: .8 mg via INTRAVENOUS

## 2013-08-09 MED ORDER — SCOPOLAMINE 1 MG/3DAYS TD PT72
1.0000 | MEDICATED_PATCH | TRANSDERMAL | Status: DC
  Filled 2013-08-09: qty 1

## 2013-08-09 MED ORDER — LIDOCAINE HCL (CARDIAC) 20 MG/ML IV SOLN
INTRAVENOUS | Status: DC | PRN
  Administered 2013-08-09: 100 mg via INTRAVENOUS

## 2013-08-09 MED ORDER — PROPOFOL 10 MG/ML IV BOLUS
INTRAVENOUS | Status: AC
  Filled 2013-08-09: qty 20

## 2013-08-09 MED ORDER — SUCCINYLCHOLINE CHLORIDE 20 MG/ML IJ SOLN
INTRAMUSCULAR | Status: DC | PRN
Start: 2013-08-09 — End: 2013-08-09
  Administered 2013-08-09: 100 mg via INTRAVENOUS

## 2013-08-09 MED ORDER — LACTATED RINGERS IV SOLN
INTRAVENOUS | Status: DC
  Administered 2013-08-09: 08:00:00 via INTRAVENOUS

## 2013-08-09 MED ORDER — ALBUTEROL SULFATE HFA 108 (90 BASE) MCG/ACT IN AERS
2.0000 | INHALATION_SPRAY | Freq: Four times a day (QID) | RESPIRATORY_TRACT | Status: DC | PRN
Start: 2013-08-09 — End: 2013-08-09

## 2013-08-09 MED ORDER — HYDROMORPHONE HCL PF 1 MG/ML IJ SOLN
1.0000 mg | INTRAMUSCULAR | Status: DC | PRN
  Administered 2013-08-09 – 2013-08-10 (×4): 1 mg via INTRAVENOUS
  Filled 2013-08-09 (×4): qty 1

## 2013-08-09 MED ORDER — GLYCOPYRROLATE 0.2 MG/ML IJ SOLN
INTRAMUSCULAR | Status: AC
  Filled 2013-08-09: qty 4

## 2013-08-09 MED ORDER — MIDAZOLAM HCL 5 MG/5ML IJ SOLN
INTRAMUSCULAR | Status: DC | PRN
  Administered 2013-08-09: 2 mg via INTRAVENOUS

## 2013-08-09 MED ORDER — KETOROLAC TROMETHAMINE 30 MG/ML IJ SOLN
INTRAMUSCULAR | Status: AC
  Filled 2013-08-09: qty 1

## 2013-08-09 MED ORDER — MIDAZOLAM HCL 2 MG/2ML IJ SOLN
INTRAMUSCULAR | Status: AC
  Filled 2013-08-09: qty 2

## 2013-08-09 MED ORDER — DEXAMETHASONE SODIUM PHOSPHATE 4 MG/ML IJ SOLN
INTRAMUSCULAR | Status: DC | PRN
  Administered 2013-08-09: 8 mg via INTRAVENOUS

## 2013-08-09 MED ORDER — ONDANSETRON HCL 4 MG PO TABS: 4.0000 mg | ORAL_TABLET | Freq: Four times a day (QID) | ORAL | Status: DC | PRN

## 2013-08-09 MED ORDER — KETOROLAC TROMETHAMINE 30 MG/ML IJ SOLN
INTRAMUSCULAR | Status: DC | PRN
  Administered 2013-08-09: 30 mg via INTRAVENOUS

## 2013-08-09 SURGICAL SUPPLY — 52 items
APL SKNCLS STERI-STRIP NONHPOA (GAUZE/BANDAGES/DRESSINGS) ×1
APPLIER CLIP 5 13 M/L LIGAMAX5 (MISCELLANEOUS)
APPLIER CLIP ROT 10 11.4 M/L (STAPLE)
APR CLP MED LRG 11.4X10 (STAPLE)
APR CLP MED LRG 5 ANG JAW (MISCELLANEOUS)
BANDAGE ADHESIVE 1X3 (GAUZE/BANDAGES/DRESSINGS) ×7 IMPLANT
BENZOIN TINCTURE PRP APPL 2/3 (GAUZE/BANDAGES/DRESSINGS) ×3 IMPLANT
BLADE SURG ROTATE 9660 (MISCELLANEOUS) IMPLANT
CANISTER SUCTION 2500CC (MISCELLANEOUS) IMPLANT
CHLORAPREP W/TINT 26ML (MISCELLANEOUS) ×3 IMPLANT
CLIP APPLIE 5 13 M/L LIGAMAX5 (MISCELLANEOUS) IMPLANT
CLIP APPLIE ROT 10 11.4 M/L (STAPLE) IMPLANT
CLOSURE WOUND 1/2 X4 (GAUZE/BANDAGES/DRESSINGS) ×1
COVER SURGICAL LIGHT HANDLE (MISCELLANEOUS) ×3 IMPLANT
DECANTER SPIKE VIAL GLASS SM (MISCELLANEOUS) ×3 IMPLANT
DEVICE SECURE STRAP 25 ABSORB (INSTRUMENTS) ×3 IMPLANT
DEVICE TROCAR PUNCTURE CLOSURE (ENDOMECHANICALS) ×3 IMPLANT
DRAPE UTILITY 15X26 W/TAPE STR (DRAPE) ×6 IMPLANT
ELECT REM PT RETURN 9FT ADLT (ELECTROSURGICAL) ×3
ELECTRODE REM PT RTRN 9FT ADLT (ELECTROSURGICAL) ×1 IMPLANT
GLOVE BIOGEL PI IND STRL 7.0 (GLOVE) IMPLANT
GLOVE BIOGEL PI IND STRL 7.5 (GLOVE) IMPLANT
GLOVE BIOGEL PI INDICATOR 7.0 (GLOVE) ×4
GLOVE BIOGEL PI INDICATOR 7.5 (GLOVE) ×2
GLOVE ECLIPSE 7.5 STRL STRAW (GLOVE) ×2 IMPLANT
GLOVE SURG SIGNA 7.5 PF LTX (GLOVE) ×3 IMPLANT
GLOVE SURG SS PI 7.0 STRL IVOR (GLOVE) ×2 IMPLANT
GOWN STRL REUS W/ TWL LRG LVL3 (GOWN DISPOSABLE) ×2 IMPLANT
GOWN STRL REUS W/ TWL XL LVL3 (GOWN DISPOSABLE) ×1 IMPLANT
GOWN STRL REUS W/TWL LRG LVL3 (GOWN DISPOSABLE) ×6
GOWN STRL REUS W/TWL XL LVL3 (GOWN DISPOSABLE) ×3
KIT BASIN OR (CUSTOM PROCEDURE TRAY) ×3 IMPLANT
KIT ROOM TURNOVER OR (KITS) ×3 IMPLANT
MARKER SKIN DUAL TIP RULER LAB (MISCELLANEOUS) ×3 IMPLANT
MESH VENTRALIGHT ST 4.5IN (Mesh General) ×2 IMPLANT
NDL SPNL 22GX3.5 QUINCKE BK (NEEDLE) ×1 IMPLANT
NEEDLE SPNL 22GX3.5 QUINCKE BK (NEEDLE) ×3 IMPLANT
NS IRRIG 1000ML POUR BTL (IV SOLUTION) ×3 IMPLANT
PAD ARMBOARD 7.5X6 YLW CONV (MISCELLANEOUS) ×6 IMPLANT
SCALPEL HARMONIC ACE (MISCELLANEOUS) IMPLANT
SCISSORS LAP 5X35 DISP (ENDOMECHANICALS) ×2 IMPLANT
SET IRRIG TUBING LAPAROSCOPIC (IRRIGATION / IRRIGATOR) IMPLANT
SLEEVE ENDOPATH XCEL 5M (ENDOMECHANICALS) ×3 IMPLANT
STRIP CLOSURE SKIN 1/2X4 (GAUZE/BANDAGES/DRESSINGS) ×1 IMPLANT
SUT MON AB 4-0 PC3 18 (SUTURE) ×3 IMPLANT
SUT NOVA NAB GS-21 0 18 T12 DT (SUTURE) ×3 IMPLANT
TOWEL OR 17X24 6PK STRL BLUE (TOWEL DISPOSABLE) ×3 IMPLANT
TOWEL OR 17X26 10 PK STRL BLUE (TOWEL DISPOSABLE) ×3 IMPLANT
TRAY FOLEY CATH 16FR SILVER (SET/KITS/TRAYS/PACK) IMPLANT
TRAY LAPAROSCOPIC (CUSTOM PROCEDURE TRAY) ×3 IMPLANT
TROCAR XCEL NON-BLD 11X100MML (ENDOMECHANICALS) ×3 IMPLANT
TROCAR XCEL NON-BLD 5MMX100MML (ENDOMECHANICALS) ×3 IMPLANT

## 2013-08-09 NOTE — Preoperative (Signed)
Beta Blockers   Reason not to administer Beta Blockers:Not Applicable 

## 2013-08-09 NOTE — Progress Notes (Signed)
Report given to angel britt rn as caregiver

## 2013-08-09 NOTE — Transfer of Care (Signed)
Immediate Anesthesia Transfer of Care Note  Patient: [name]  Procedure(s) Performed: Procedure(s): LAPAROSCOPIC INCISIONAL HERNIA (N/A) INSERTION OF MESH (N/A)  Patient Location: PACU  Anesthesia Type:General  Level of Consciousness: awake, alert  and oriented  Airway & Oxygen Therapy: Patient Spontanous Breathing and Patient connected to nasal cannula oxygen  Post-op Assessment: Report given to PACU RN, Post -op Vital signs reviewed and stable and Patient moving all extremities X 4  Post vital signs: Reviewed and stable  Complications: No apparent anesthesia complications

## 2013-08-09 NOTE — Interval H&P Note (Signed)
History and Physical Interval Note: no change in H and P  08/09/2013 7:54 AM  [name]  has presented today for surgery, with the diagnosis of hernia  The various methods of treatment have been discussed with the patient and family. After consideration of risks, benefits and other options for treatment, the patient has consented to  Procedure(s): LAPAROSCOPIC INCISIONAL HERNIA (N/A) INSERTION OF MESH (N/A) as a surgical intervention .  The patient's history has been reviewed, patient examined, no change in status, stable for surgery.  I have reviewed the patient's chart and labs.  Questions were answered to the patient's satisfaction.     Kreed Kauffman A

## 2013-08-09 NOTE — Op Note (Signed)
LAPAROSCOPIC INCISIONAL HERNIA, INSERTION OF MESH  Procedure Note  [name] 08/09/2013   Pre-op Diagnosis: hernia     Post-op Diagnosis: same  Procedure(s): LAPAROSCOPIC INCISIONAL HERNIA INSERTION OF MESH (11.2 cm round Ventra-Lite)  Surgeon(s): [name], MD  Anesthesia: General  Staff:  Circulator: Montel Culver, RN Scrub Person: Leslie Andrea, CST; Cyd Silence, RN Circulator Assistant: Cyd Silence, RN RN First Assistant: Cyd Silence, RN  Estimated Blood Loss: Minimal                         [name]   Date: 08/09/2013  Time: 10:38 AM

## 2013-08-09 NOTE — Anesthesia Preprocedure Evaluation (Addendum)
Anesthesia Evaluation  Patient identified by MRN, date of birth, ID band Patient awake    Reviewed: Allergy & Precautions, H&P , NPO status , Patient's Chart, lab work & pertinent test results  History of Anesthesia Complications (+) PONV and history of anesthetic complications  Airway Mallampati: II TM Distance: >3 FB Neck ROM: Full    Dental  (+) Teeth Intact, Dental Advisory Given   Pulmonary asthma ,          Cardiovascular     Neuro/Psych    GI/Hepatic GERD-  Medicated and Controlled,  Endo/Other  Hypothyroidism Morbid obesity  Renal/GU      Musculoskeletal   Abdominal   Peds  Hematology   Anesthesia Other Findings   Reproductive/Obstetrics                          Anesthesia Physical Anesthesia Plan  ASA: III  Anesthesia Plan: General   Post-op Pain Management:    Induction: Intravenous  Airway Management Planned: Oral ETT  Additional Equipment:   Intra-op Plan:   Post-operative Plan: Extubation in OR  Informed Consent: I have reviewed the patients History and Physical, chart, labs and discussed the procedure including the risks, benefits and alternatives for the proposed anesthesia with the patient or authorized representative who has indicated his/her understanding and acceptance.     Plan Discussed with: CRNA and Surgeon  Anesthesia Plan Comments:         Anesthesia Quick Evaluation

## 2013-08-10 ENCOUNTER — Encounter (HOSPITAL_COMMUNITY): Payer: Self-pay | Admitting: Surgery

## 2013-08-10 DIAGNOSIS — K432 Incisional hernia without obstruction or gangrene: Secondary | ICD-10-CM | POA: Diagnosis not present

## 2013-08-10 MED ORDER — HYDROCODONE-ACETAMINOPHEN 5-325 MG PO TABS: 1.0000 | ORAL_TABLET | ORAL | Status: AC | PRN

## 2013-08-10 NOTE — Op Note (Signed)
[name], [name]                  ACCOUNT NO.:  [id number]  MEDICAL RECORD NO.:  [id number]  LOCATION:  6N31C                        FACILITY:  [facility]  PHYSICIAN:  Coralie Keens, M.D. DATE OF BIRTH:  [birth year]  DATE OF PROCEDURE:  08/09/2013 DATE OF DISCHARGE:                              OPERATIVE REPORT   PREOPERATIVE DIAGNOSIS:  Incisional hernia.  POSTOPERATIVE DIAGNOSIS:  Incisional hernia.  PROCEDURE:  Incisional hernia repair with mesh.  SURGEON:  Coralie Keens, M.D.  ANESTHESIA:  General and 0.5% Marcaine.  ESTIMATED BLOOD LOSS:  Minimal.  FINDINGS:  The patient was found to have an incisional hernia at the 10 mm trocar site from her previous laparoscopic procedure.  Her previous mesh around the umbilicus was intact and not involved with the new hernia.  The hernia was repaired widely with a 11 cm round Ventralight laparoscopic mesh from [employer].  PROCEDURE IN DETAIL:  The  patient brought to the operating room, identified as [name].  She was placed on the operating table and general anesthesia was induced.  Her abdomen was then prepped and draped in usual sterile fashion.  I made a small incision with a scalpel in the left upper quadrant through a previous scar.  I then used a 5-mm trocar and Optiview camera to slowly traverse all layers of the abdominal wall under direct vision and gain entrance into the peritoneal cavity. Insufflation of the abdomen was then begun.  I examined the entrance site and saw no evidence of injury.  The patient had a moderate amount of omentum tethered to the abdominal wall with small 3 cm fascial defect in the upper midline at a previous trocar site.  I placed an 11 mm port through a small incision in the left lower quadrant under direct vision. I was easily able to reduce all the omentum from the previous mesh and the abdominal wall as well as reduce the omentum from the hernia defect. No bowel was involved with any of the  adhesions.  At this point, the defect was measured.  I then brought an 11 mm round Ventralight patch from [employer] onto the field.  I placed 4 separate 0 Novafil stay sutures in the mesh.  The mesh was then rolled up and placed through the 11-mm trocar and rolled under direct vision in the abdominal cavity.  I then made 4 separate stab incisions with a scalpel and used the suture passer to pull all ends of the suture up to the abdominal wall in 4 separate locations.  I then tied these in place, pulling the mesh up against the peritoneal surface.  Wide coverage of the fascial defect greater than 4 cm was achieved in all directions.  I then tacked the mesh in circumferentially with the absorbable tacker.  I again evaluated the repair and repair appeared to be widely intact.  I then examined the rest the abdominal cavity and felt no abnormalities and hemostasis appeared to be achieved.  At this point, the abdomen was deflated and all trocars were removed under direct vision.  All incisions were then anesthetized with Marcaine and closed with 4-0 Monocryl subcuticular sutures.  Steri-Strips and Band-Aids were  then applied.  The patient tolerated the procedure well. All the counts were correct at the end of procedure.  The patient was then extubated in the operating room and taken in stable condition to recovery room.     Coralie Keens, M.D.     DB/MEDQ  D:  08/09/2013  T:  08/10/2013  Job:  [id number]

## 2013-08-10 NOTE — Discharge Instructions (Signed)
What to eat:  For your first meals, you should eat lightly; only small meals initially.  If you do not have nausea, you may eat larger meals.  Avoid spicy, greasy and heavy food.    General Anesthesia, Adult, Care After  Refer to this sheet in the next few weeks. These instructions provide you with information on caring for yourself after your procedure. Your health care provider may also give you more specific instructions. Your treatment has been planned according to current medical practices, but problems sometimes occur. Call your health care provider if you have any problems or questions after your procedure.  WHAT TO EXPECT AFTER THE PROCEDURE  After the procedure, it is typical to experience:  Sleepiness.  Nausea and vomiting. HOME CARE INSTRUCTIONS  For the first 24 hours after general anesthesia:  Have a responsible person with you.  Do not drive a car. If you are alone, do not take public transportation.  Do not drink alcohol.  Do not take medicine that has not been prescribed by your health care provider.  Do not sign important papers or make important decisions.  You may resume a normal diet and activities as directed by your health care provider.  Change bandages (dressings) as directed.  If you have questions or problems that seem related to general anesthesia, call the hospital and ask for the anesthetist or anesthesiologist on call. SEEK MEDICAL CARE IF:  You have nausea and vomiting that continue the day after anesthesia.  You develop a rash. SEEK IMMEDIATE MEDICAL CARE IF:  You have difficulty breathing.  You have chest pain.  You have any allergic problems. Document Released: 08/09/2000 Document Revised: 01/03/2013 Document Reviewed: 11/16/2012  [facility] Patient Information 2014 [place], [place].   CCS ______[facility] LAPAROSCOPIC SURGERY: POST OP INSTRUCTIONS Always review your discharge instruction sheet given to you by the facility where your  surgery was performed. IF YOU HAVE DISABILITY OR FAMILY LEAVE FORMS, YOU MUST BRING THEM TO THE OFFICE FOR PROCESSING.   DO NOT GIVE THEM TO YOUR DOCTOR.  1. A prescription for pain medication may be given to you upon discharge.  Take your pain medication as prescribed, if needed.  If narcotic pain medicine is not needed, then you may take acetaminophen (Tylenol) or ibuprofen (Advil) as needed. 2. Take your usually prescribed medications unless otherwise directed. 3. If you need a refill on your pain medication, please contact your pharmacy.  They will contact our office to request authorization. Prescriptions will not be filled after 5pm or on week-ends. 4. You should follow a light diet the first few days after arrival home, such as soup and crackers, etc.  Be sure to include lots of fluids daily. 5. Most patients will experience some swelling and bruising in the area of the incisions.  Ice packs will help.  Swelling and bruising can take several days to resolve.  6. It is common to experience some constipation if taking pain medication after surgery.  Increasing fluid intake and taking a stool softener (such as Colace) will usually help or prevent this problem from occurring.  A mild laxative (Milk of Magnesia or Miralax) should be taken according to package instructions if there are no bowel movements after 48 hours. 7. Unless discharge instructions indicate otherwise, you may remove your bandages 24-48 hours after surgery, and you may shower at that time.  You may have steri-strips (small skin tapes) in place directly over the incision.  These strips should be left on  the skin for 7-10 days.  If your surgeon used skin glue on the incision, you may shower in 24 hours.  The glue will flake off over the next 2-3 weeks.  Any sutures or staples will be removed at the office during your follow-up visit. 8. ACTIVITIES:  You may resume regular (light) daily activities beginning the next day--such as daily  self-care, walking, climbing stairs--gradually increasing activities as tolerated.  You may have sexual intercourse when it is comfortable.  Refrain from any heavy lifting or straining until approved by your doctor. a. You may drive when you are no longer taking prescription pain medication, you can comfortably wear a seatbelt, and you can safely maneuver your car and apply brakes. b. RETURN TO WORK:  __________________________________________________________ 9. You should see your doctor in the office for a follow-up appointment approximately 2-3 weeks after your surgery.  Make sure that you call for this appointment within a day or two after you arrive home to insure a convenient appointment time. 10. OTHER INSTRUCTIONS: __NO LIFTING MORE THAN 15 TO 20 POUNDS FOR 3 TO 4 WEEKS 11. ICE PACK AND IBUPROFEN ALSO FOR PAIN________________________________________________________________________________________________________________________ __________________________________________________________________________________________________________________________ WHEN TO CALL YOUR DOCTOR: 1. Fever over 101.0 2. Inability to urinate 3. Continued bleeding from incision. 4. Increased pain, redness, or drainage from the incision. 5. Increasing abdominal pain  The clinic staff is available to answer your questions during regular business hours.  Please dont hesitate to call and ask to speak to one of the nurses for clinical concerns.  If you have a medical emergency, go to the nearest emergency room or call 911.  A surgeon from [facility] is always on call at the hospital. [address] [place], [place], [place]  [address] ? [address], [place], [place]   [address] [phone] ? [phone] ? FAX [phone] Web site: [email or web address]

## 2013-08-10 NOTE — Discharge Summary (Signed)
Physician Discharge Summary  Patient ID: [name] MRN: [id number] DOB/AGE: 11/04/1960 53 y.o.  Admit date: 08/09/2013 Discharge date: 08/10/2013  Admission Diagnoses:  Discharge Diagnoses:  Active Problems:   Incisional hernia   Discharged Condition: good  Hospital Course: uneventful post op recovery.  Discharged POD#1  Consults: None  Significant Diagnostic Studies:   Treatments: surgery: laparoscopic incisional hernia repair with mesh  Discharge Exam: Blood pressure 108/55, pulse 65, temperature 98.7 F (37.1 C), temperature source Oral, resp. rate 18, height 5\' 5"  (1.651 m), weight 288 lb 3.2 oz (130.727 kg), last menstrual period 02/23/2012, SpO2 91.00%. General appearance: alert and no distress Resp: clear to auscultation bilaterally Cardio: regular rate and rhythm, S1, S2 normal, no murmur, click, rub or gallop Incision/Wound: abdomen soft, dressings dry  Disposition: 01-Home or Self Care   Future Appointments Provider Department Dept Phone   08/27/2013 10:50 AM [name], MD [facility], [place] [phone]       Medication List         albuterol 108 (90 BASE) MCG/ACT inhaler  Commonly known as:  PROVENTIL HFA;VENTOLIN HFA  - Inhale 2 puffs into the lungs every 6 (six) hours as needed. Shortness of breath  -      cetirizine 10 MG chewable tablet  Commonly known as:  ZYRTEC  Chew 10 mg by mouth daily.     diphenhydrAMINE 25 MG tablet  Commonly known as:  BENADRYL  Take 25 mg by mouth every 4 (four) hours as needed for allergies.     esomeprazole 40 MG capsule  Commonly known as:  NEXIUM  Take 40 mg by mouth daily before breakfast.     fexofenadine 180 MG tablet  Commonly known as:  ALLEGRA  Take 180 mg by mouth daily.     HYDROcodone-acetaminophen 5-325 MG per tablet  Commonly known as:  NORCO  Take 1-2 tablets by mouth every 4 (four) hours as needed.     ibuprofen 200 MG tablet  Commonly known as:  ADVIL,MOTRIN  Take 400  mg by mouth every 6 (six) hours as needed.     levothyroxine 75 MCG tablet  Commonly known as:  SYNTHROID, LEVOTHROID  Take 75 mcg by mouth daily.     STOOL SOFTENER PO  Take 1 tablet by mouth 2 (two) times daily as needed (constipation).     SUDAFED 12 HOUR 120 MG 12 hr tablet  Generic drug:  pseudoephedrine  Take 120 mg by mouth 2 (two) times daily.     SUDAFED PO  Take 1 tablet by mouth every 12 (twelve) hours as needed (congestions). 325-10-10-5           Follow-up Information   Follow up with [facility], MD. Call in 2 weeks.   Specialty:  General Surgery   Contact information:   [address] [place] [address] [phone]       Signed: [name] 08/10/2013, 6:56 AM

## 2013-08-10 NOTE — Anesthesia Postprocedure Evaluation (Signed)
Anesthesia Post Note  Patient: [name]  Procedure(s) Performed: Procedure(s) (LRB): LAPAROSCOPIC INCISIONAL HERNIA (N/A) INSERTION OF MESH (N/A)  Anesthesia type: general  Patient location: PACU  Post pain: Pain level controlled  Post assessment: Patient's Cardiovascular Status Stable  Last Vitals:  Filed Vitals:   08/10/13 0423  BP: 108/55  Pulse: 65  Temp: 37.1 C  Resp: 18    Post vital signs: Reviewed and stable  Level of consciousness: sedated  Complications: No apparent anesthesia complications

## 2013-08-10 NOTE — Progress Notes (Signed)
Patient ID: [name], female   DOB: [birth year], 53 y.o.   MRN: [id number]  Doing well Abdomen soft  Plan discharge

## 2013-08-10 NOTE — Progress Notes (Signed)
Discharge patient to home. Home discharge instruction given, no questions verbalized.

## 2013-08-12 ENCOUNTER — Telehealth (INDEPENDENT_AMBULATORY_CARE_PROVIDER_SITE_OTHER): Payer: Self-pay | Admitting: Surgery

## 2013-08-12 DIAGNOSIS — R21 Rash and other nonspecific skin eruption: Secondary | ICD-10-CM

## 2013-08-12 DIAGNOSIS — Z9889 Other specified postprocedural states: Secondary | ICD-10-CM

## 2013-08-12 DIAGNOSIS — J302 Other seasonal allergic rhinitis: Secondary | ICD-10-CM

## 2013-08-12 DIAGNOSIS — K589 Irritable bowel syndrome without diarrhea: Secondary | ICD-10-CM

## 2013-08-12 DIAGNOSIS — R112 Nausea with vomiting, unspecified: Secondary | ICD-10-CM

## 2013-08-12 MED ORDER — PROMETHAZINE HCL 12.5 MG PO TABS: 12.5000 mg | ORAL_TABLET | Freq: Four times a day (QID) | ORAL | Status: AC | PRN

## 2013-08-12 MED ORDER — PROMETHAZINE HCL 25 MG RE SUPP
25.0000 mg | Freq: Four times a day (QID) | RECTAL | Status: AC | PRN
Start: 2013-08-12 — End: ?
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
## 2013-08-13 ENCOUNTER — Telehealth (INDEPENDENT_AMBULATORY_CARE_PROVIDER_SITE_OTHER): Payer: Self-pay | Admitting: General Surgery

## 2013-08-13 NOTE — Telephone Encounter (Signed)
Called [name] this morning to check on her to see how she was feeling, she had called the on call doctor this weekend and it was Dr Johney [place] and he wanted to check on her and she stated that she is feeling a lot better, no more vomiting , she had an BM this morning and she stated that she is taking an stool softener, no fever and I told her to keep her apt with Dr Ninfa Linden on the 08-27-13, I told her that if she needs [place] before then to call the office and she agree to do so

## 2013-08-16 ENCOUNTER — Telehealth (INDEPENDENT_AMBULATORY_CARE_PROVIDER_SITE_OTHER): Payer: Self-pay

## 2013-08-16 NOTE — Telephone Encounter (Signed)
Pt husband called in stating wife has broken out around breats and bottom of abdomen. It is shaped like a box making me think it is from the drapes used at surgery. They have been using some itch cream and she is still itching and has blisters. Advised them to take benadryl ans he says they have been using it for a day or so. Advised him to use hydrocortisone cream and keep taking benadryl. There is no other medications we can prescribe for this. It is something that just has to get better with time. They will ry the hydrocortisone cream and benadryl and call back around lunch time if no better to see Dr Ninfa Linden this afternoon.

## 2013-08-26 NOTE — Telephone Encounter (Signed)
Pt due to see DB Mon 4/13?  i could see her wed 4/15 or maybe Mon 9-10AM myself instead

## 2013-08-27 ENCOUNTER — Encounter (INDEPENDENT_AMBULATORY_CARE_PROVIDER_SITE_OTHER): Payer: Self-pay | Admitting: Surgery

## 2013-08-27 ENCOUNTER — Ambulatory Visit (INDEPENDENT_AMBULATORY_CARE_PROVIDER_SITE_OTHER): Payer: BC Managed Care – PPO | Admitting: Surgery

## 2013-08-27 VITALS — BP 142/80 | HR 76 | Temp 97.5°F | Resp 16 | Ht 64.0 in | Wt 289.2 lb

## 2013-08-27 DIAGNOSIS — Z09 Encounter for follow-up examination after completed treatment for conditions other than malignant neoplasm: Secondary | ICD-10-CM

## 2013-08-27 NOTE — Progress Notes (Signed)
Subjective:     Patient ID: [name], female   DOB: [birth year], 53 y.o.   MRN: [id number]  HPI She is here for her first postoperative visit status post laparoscopic incisional hernia repair with mesh. Other than a rash that had developed from the abdominal wall prep, she has done well. She has no complaints today  Review of Systems     Objective:   Physical Exam On exam, her incisions are healing well and there is no evidence of recurrent hernia    Assessment:     Patient stable postop     Plan:     She may resume normal activities. I will see her back as needed

## 2013-09-10 ENCOUNTER — Other Ambulatory Visit: Payer: Self-pay | Admitting: Family Medicine

## 2013-09-10 DIAGNOSIS — R1011 Right upper quadrant pain: Secondary | ICD-10-CM

## 2013-09-12 ENCOUNTER — Ambulatory Visit
Admission: RE | Admit: 2013-09-12 | Discharge: 2013-09-12 | Disposition: A | Discharge status: Home / Self Care | Payer: BC Managed Care – PPO | Source: Ambulatory Visit | Attending: Family Medicine | Admitting: Family Medicine

## 2013-09-12 DIAGNOSIS — R1011 Right upper quadrant pain: Secondary | ICD-10-CM

## 2013-09-12 MED ORDER — IOHEXOL 300 MG/ML  SOLN
125.0000 mL | Freq: Once | INTRAMUSCULAR | Status: AC | PRN
  Administered 2013-09-12: 125 mL via INTRAVENOUS

## 2013-10-15 ENCOUNTER — Other Ambulatory Visit: Payer: Self-pay

## 2013-10-15 DIAGNOSIS — Z1231 Encounter for screening mammogram for malignant neoplasm of breast: Secondary | ICD-10-CM

## 2013-10-18 ENCOUNTER — Ambulatory Visit: Payer: BC Managed Care – PPO

## 2013-11-09 ENCOUNTER — Other Ambulatory Visit: Payer: Self-pay

## 2013-11-09 DIAGNOSIS — Z803 Family history of malignant neoplasm of breast: Secondary | ICD-10-CM

## 2013-11-09 DIAGNOSIS — Z1231 Encounter for screening mammogram for malignant neoplasm of breast: Secondary | ICD-10-CM

## 2013-11-20 ENCOUNTER — Ambulatory Visit
Admission: RE | Admit: 2013-11-20 | Discharge: 2013-11-20 | Disposition: A | Discharge status: Home / Self Care | Payer: BC Managed Care – PPO | Source: Ambulatory Visit

## 2013-11-20 DIAGNOSIS — Z803 Family history of malignant neoplasm of breast: Secondary | ICD-10-CM

## 2013-11-20 DIAGNOSIS — Z1231 Encounter for screening mammogram for malignant neoplasm of breast: Secondary | ICD-10-CM

## 2014-03-01 ENCOUNTER — Other Ambulatory Visit: Payer: Self-pay

## 2014-03-18 ENCOUNTER — Encounter (INDEPENDENT_AMBULATORY_CARE_PROVIDER_SITE_OTHER): Payer: Self-pay | Admitting: Surgery

## 2014-06-18 ENCOUNTER — Other Ambulatory Visit: Payer: Self-pay | Admitting: Family Medicine

## 2014-06-18 DIAGNOSIS — R140 Abdominal distension (gaseous): Secondary | ICD-10-CM

## 2014-06-18 DIAGNOSIS — R109 Unspecified abdominal pain: Secondary | ICD-10-CM

## 2014-06-21 ENCOUNTER — Ambulatory Visit
Admission: RE | Admit: 2014-06-21 | Discharge: 2014-06-21 | Disposition: A | Discharge status: Home / Self Care | Payer: BLUE CROSS/BLUE SHIELD | Source: Ambulatory Visit | Attending: Family Medicine | Admitting: Family Medicine

## 2014-06-21 DIAGNOSIS — R109 Unspecified abdominal pain: Secondary | ICD-10-CM

## 2014-06-21 DIAGNOSIS — R140 Abdominal distension (gaseous): Secondary | ICD-10-CM

## 2014-06-25 ENCOUNTER — Other Ambulatory Visit: Payer: Medicaid Other

## 2014-07-09 ENCOUNTER — Other Ambulatory Visit: Payer: Self-pay | Admitting: Gastroenterology

## 2014-07-09 DIAGNOSIS — R1012 Left upper quadrant pain: Secondary | ICD-10-CM

## 2014-07-16 ENCOUNTER — Ambulatory Visit (HOSPITAL_COMMUNITY)
Admission: RE | Admit: 2014-07-16 | Discharge: 2014-07-16 | Disposition: A | Discharge status: Home / Self Care | Payer: BLUE CROSS/BLUE SHIELD | Source: Ambulatory Visit | Attending: Gastroenterology | Admitting: Gastroenterology

## 2014-07-16 ENCOUNTER — Encounter (HOSPITAL_COMMUNITY): Payer: Self-pay

## 2014-07-16 ENCOUNTER — Ambulatory Visit (HOSPITAL_COMMUNITY): Payer: BLUE CROSS/BLUE SHIELD

## 2014-07-16 DIAGNOSIS — R1032 Left lower quadrant pain: Secondary | ICD-10-CM | POA: Diagnosis not present

## 2014-07-16 DIAGNOSIS — R1012 Left upper quadrant pain: Secondary | ICD-10-CM

## 2014-07-16 MED ORDER — IOHEXOL 300 MG/ML  SOLN
100.0000 mL | Freq: Once | INTRAMUSCULAR | Status: AC | PRN
  Administered 2014-07-16: 100 mL via INTRAVENOUS

## 2014-12-18 ENCOUNTER — Ambulatory Visit: Payer: BLUE CROSS/BLUE SHIELD | Admitting: Physical Therapy

## 2014-12-24 ENCOUNTER — Ambulatory Visit: Payer: BLUE CROSS/BLUE SHIELD | Attending: Sports Medicine | Admitting: Physical Therapy

## 2014-12-24 DIAGNOSIS — M545 Low back pain: Secondary | ICD-10-CM | POA: Diagnosis not present
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
## 2014-12-31 ENCOUNTER — Encounter: Payer: Self-pay | Admitting: Physical Therapy

## 2014-12-31 ENCOUNTER — Ambulatory Visit: Payer: BLUE CROSS/BLUE SHIELD | Admitting: Physical Therapy

## 2014-12-31 DIAGNOSIS — M545 Low back pain: Secondary | ICD-10-CM
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
## 2015-01-02 ENCOUNTER — Encounter: Payer: Self-pay | Admitting: Physical Therapy

## 2015-01-02 ENCOUNTER — Ambulatory Visit: Payer: BLUE CROSS/BLUE SHIELD | Admitting: Physical Therapy

## 2015-01-02 DIAGNOSIS — M545 Low back pain: Secondary | ICD-10-CM | POA: Diagnosis not present
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
## 2015-01-07 ENCOUNTER — Encounter: Payer: BLUE CROSS/BLUE SHIELD | Admitting: Physical Therapy

## 2015-01-09 ENCOUNTER — Encounter: Payer: Self-pay | Admitting: Physical Therapy

## 2015-01-09 ENCOUNTER — Ambulatory Visit: Payer: BLUE CROSS/BLUE SHIELD | Admitting: Physical Therapy

## 2015-01-09 DIAGNOSIS — M545 Low back pain: Secondary | ICD-10-CM | POA: Diagnosis not present
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
## 2015-01-13 ENCOUNTER — Ambulatory Visit: Payer: BLUE CROSS/BLUE SHIELD | Admitting: Physical Therapy

## 2015-01-13 DIAGNOSIS — M545 Low back pain: Secondary | ICD-10-CM
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
## 2015-01-14 ENCOUNTER — Ambulatory Visit: Payer: BLUE CROSS/BLUE SHIELD | Admitting: Physical Therapy

## 2015-01-14 DIAGNOSIS — M545 Low back pain: Secondary | ICD-10-CM
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
## 2015-01-23 ENCOUNTER — Ambulatory Visit: Payer: BLUE CROSS/BLUE SHIELD | Attending: Sports Medicine | Admitting: Physical Therapy

## 2015-01-23 DIAGNOSIS — M545 Low back pain: Secondary | ICD-10-CM | POA: Insufficient documentation
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
## 2015-01-28 ENCOUNTER — Encounter: Payer: BLUE CROSS/BLUE SHIELD | Admitting: Physical Therapy

## 2015-01-30 ENCOUNTER — Ambulatory Visit: Payer: BLUE CROSS/BLUE SHIELD | Admitting: *Deleted

## 2015-01-30 DIAGNOSIS — M545 Low back pain: Secondary | ICD-10-CM
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
## 2015-02-04 ENCOUNTER — Encounter: Payer: Self-pay | Admitting: *Deleted

## 2015-02-04 ENCOUNTER — Ambulatory Visit: Payer: BLUE CROSS/BLUE SHIELD | Admitting: *Deleted

## 2015-02-04 DIAGNOSIS — M545 Low back pain: Secondary | ICD-10-CM
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
## 2015-02-06 ENCOUNTER — Encounter: Payer: Self-pay | Admitting: Physical Therapy

## 2015-02-06 ENCOUNTER — Ambulatory Visit: Payer: BLUE CROSS/BLUE SHIELD | Admitting: Physical Therapy

## 2015-02-06 DIAGNOSIS — M545 Low back pain: Secondary | ICD-10-CM | POA: Diagnosis not present
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
## 2015-02-11 ENCOUNTER — Ambulatory Visit: Payer: BLUE CROSS/BLUE SHIELD | Admitting: *Deleted

## 2015-02-11 ENCOUNTER — Encounter: Payer: Self-pay | Admitting: *Deleted

## 2015-02-11 DIAGNOSIS — M545 Low back pain: Secondary | ICD-10-CM
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
## 2015-02-13 ENCOUNTER — Encounter: Payer: BLUE CROSS/BLUE SHIELD | Admitting: Physical Therapy

## 2016-01-26 ENCOUNTER — Other Ambulatory Visit: Payer: Self-pay | Admitting: Obstetrics and Gynecology

## 2016-01-26 DIAGNOSIS — N644 Mastodynia: Secondary | ICD-10-CM

## 2016-01-30 ENCOUNTER — Other Ambulatory Visit: Payer: Self-pay | Admitting: Obstetrics and Gynecology

## 2016-01-30 DIAGNOSIS — N644 Mastodynia: Secondary | ICD-10-CM

## 2016-02-04 ENCOUNTER — Other Ambulatory Visit: Payer: BLUE CROSS/BLUE SHIELD

## 2016-02-05 ENCOUNTER — Other Ambulatory Visit: Payer: BLUE CROSS/BLUE SHIELD

## 2016-02-10 ENCOUNTER — Ambulatory Visit
Admission: RE | Admit: 2016-02-10 | Discharge: 2016-02-10 | Disposition: A | Discharge status: Home / Self Care | Payer: BLUE CROSS/BLUE SHIELD | Source: Ambulatory Visit | Attending: Obstetrics and Gynecology | Admitting: Obstetrics and Gynecology

## 2016-02-10 DIAGNOSIS — N644 Mastodynia: Secondary | ICD-10-CM

## 2016-08-24 IMAGING — US US ABDOMEN COMPLETE
1 series · 14 of 25 positions shown · non-contrast
Comparison: CT 09/12/2013

CLINICAL DATA: Generalized abdominal pain and distention 2 months.
Previous cholecystectomy.

EXAM:
ULTRASOUND ABDOMEN COMPLETE

[Series 1: us abdomen complete · 0.33mm/px · 14 of 56 slices shown]
[im 1/56]
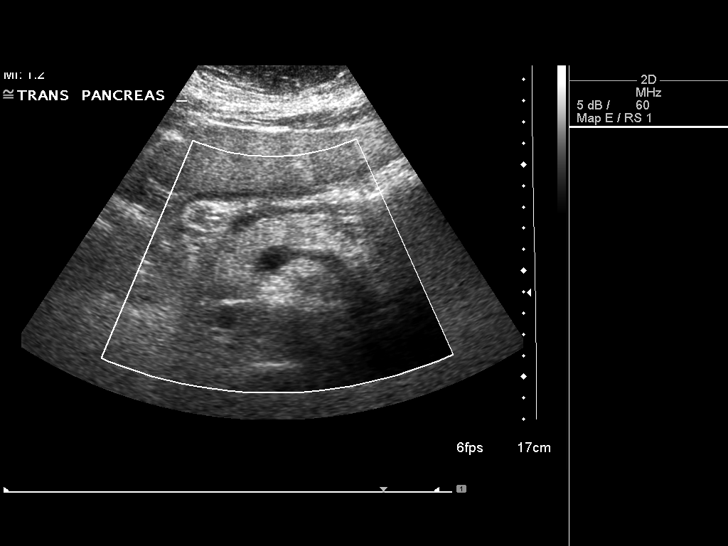
[im 5/56]
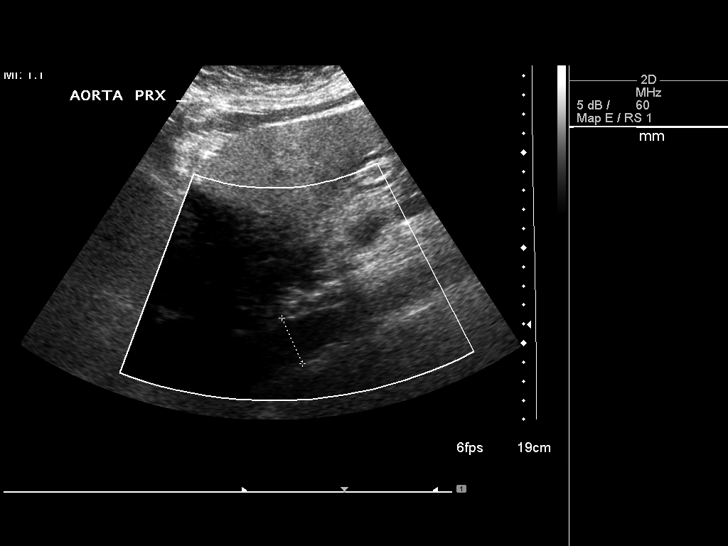
[im 10/56]
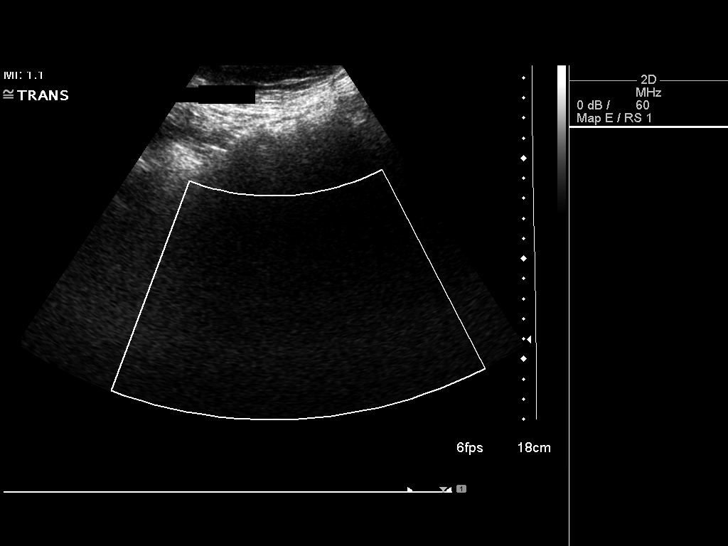
[im 14/56]
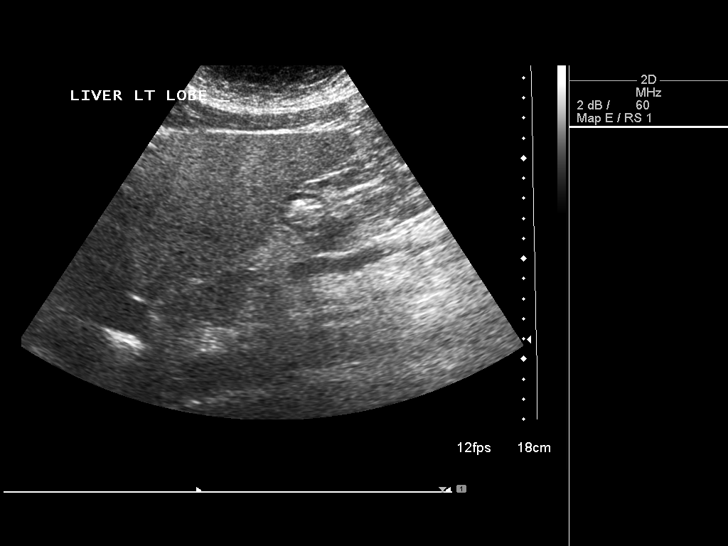
[im 19/56]
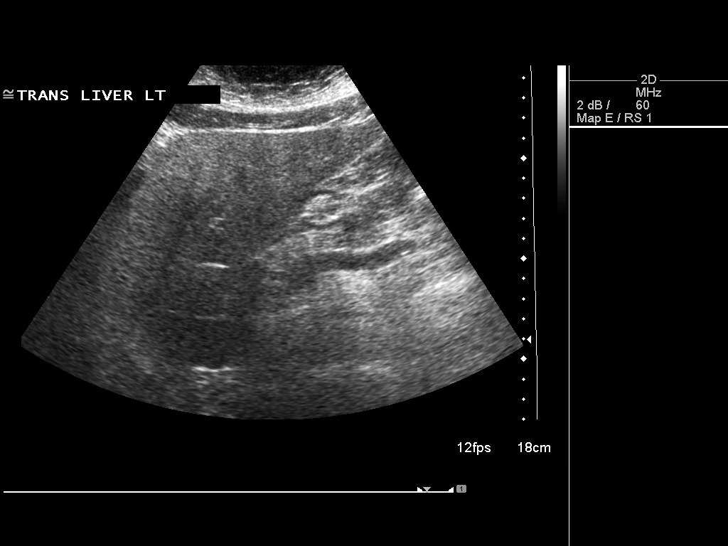
[im 21/56]
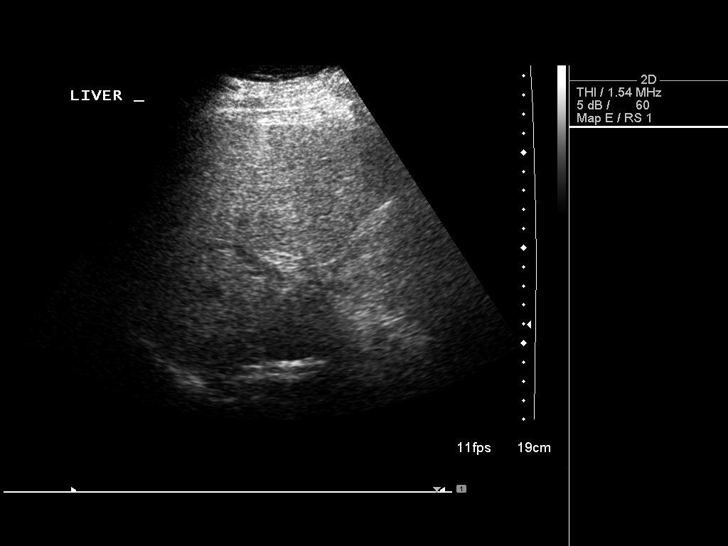
[im 26/56]
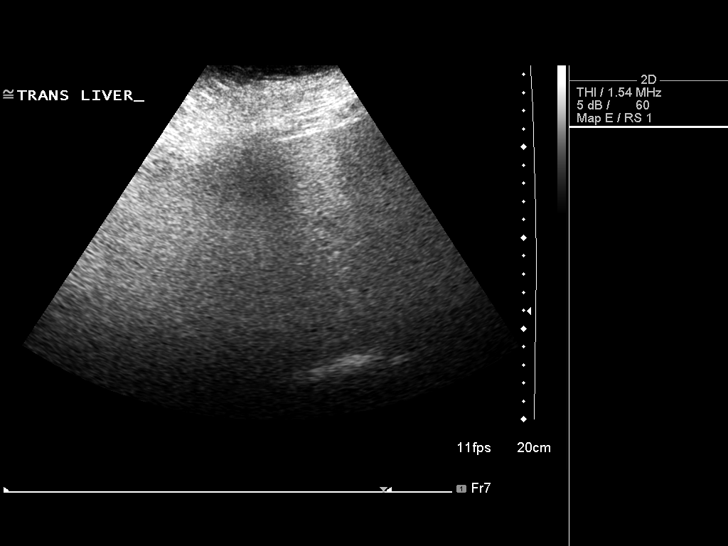
[im 30/56]
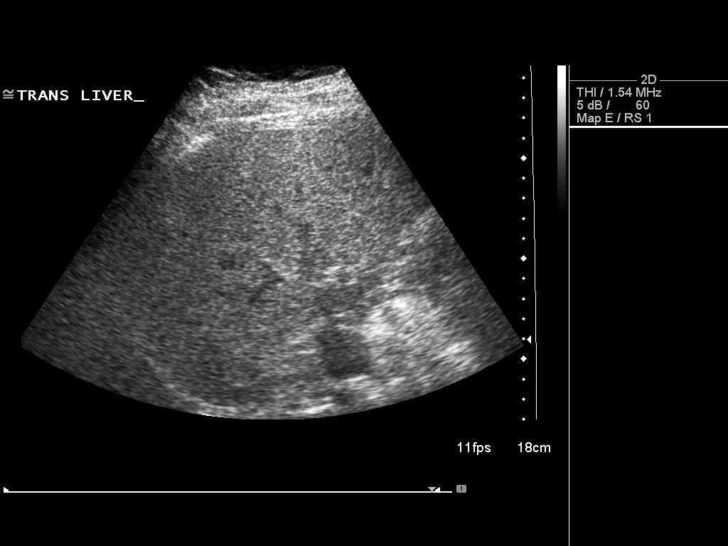
[im 35/56]
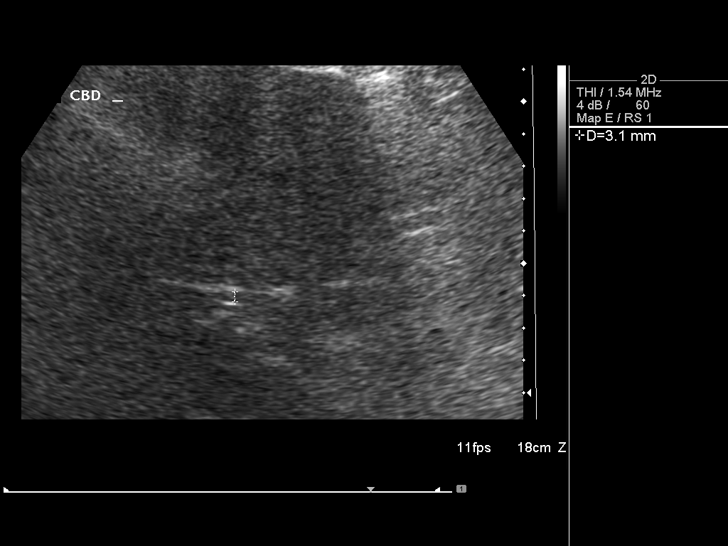
[im 37/56]
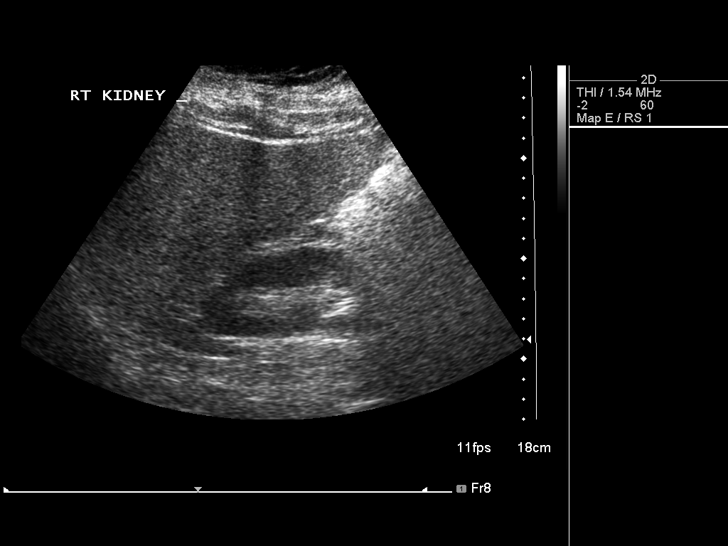
[im 42/56]
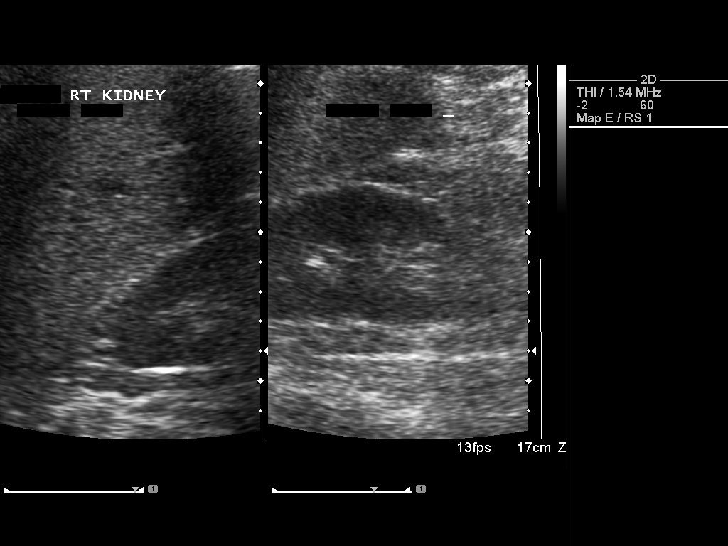
[im 46/56]
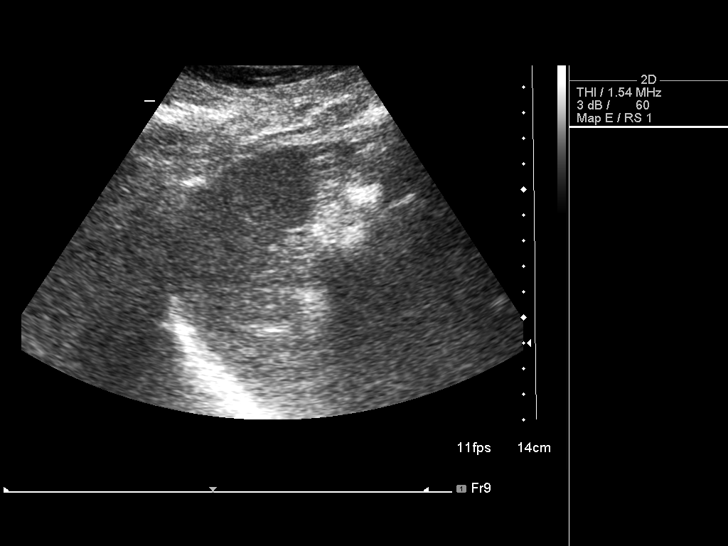
[im 51/56]
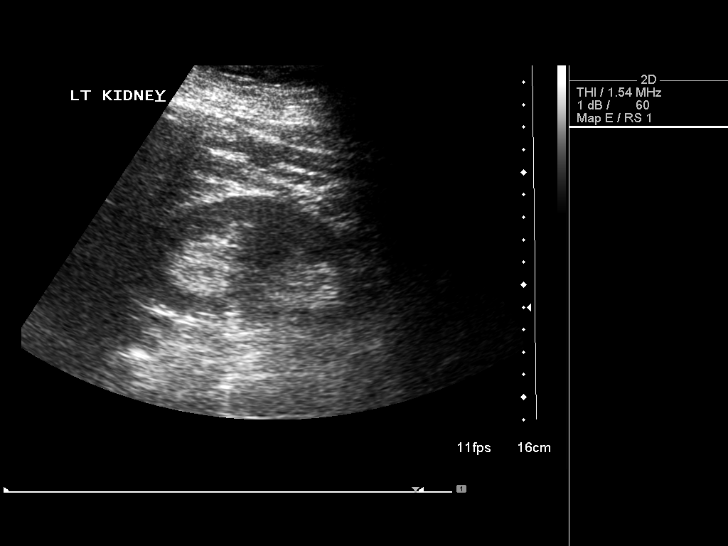
[im 56/56]
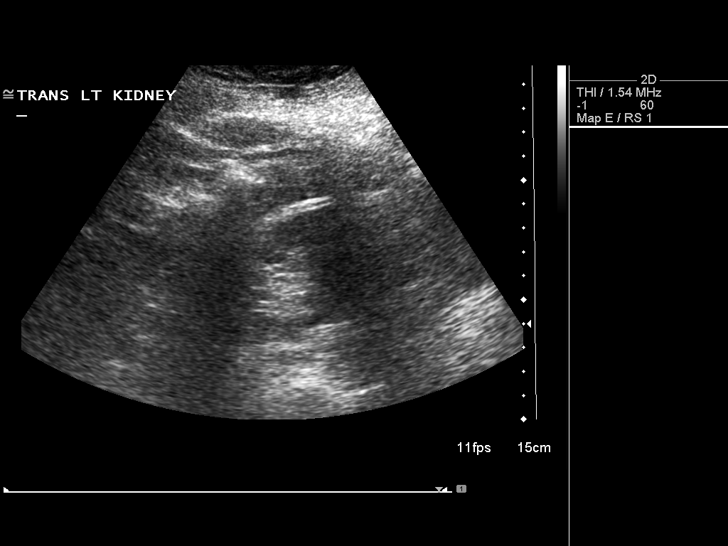

[14 of 25 positions shown; findings below may reference images not displayed]

FINDINGS: Gallbladder: Previous cholecystectomy.

Common bile duct: Diameter: 3.1 mm.

Liver: Moderate diffuse hepatic steatosis without focal mass.

IVC: No abnormality visualized.

Pancreas: Visualized portion unremarkable.

Spleen: Size and appearance within normal limits.

Right Kidney: Length: 9.6 cm. Echogenicity within normal limits. No
mass or hydronephrosis visualized.

Left Kidney: Length: 11.6 cm. Echogenicity within normal limits. No
mass or hydronephrosis visualized.

Abdominal aorta: Proximal to mid segment within normal as the distal
segment not well seen due to overlying bowel gas.

Other findings: None.
IMPRESSION: No acute hepatobiliary findings.

Moderate diffuse hepatic steatosis without focal mass.

## 2017-01-19 ENCOUNTER — Other Ambulatory Visit: Payer: Self-pay | Admitting: Obstetrics and Gynecology

## 2017-01-19 DIAGNOSIS — Z1231 Encounter for screening mammogram for malignant neoplasm of breast: Secondary | ICD-10-CM

## 2017-02-10 ENCOUNTER — Ambulatory Visit: Payer: BLUE CROSS/BLUE SHIELD

## 2017-02-17 ENCOUNTER — Ambulatory Visit
Admission: RE | Admit: 2017-02-17 | Discharge: 2017-02-17 | Disposition: A | Discharge status: Home / Self Care | Payer: BLUE CROSS/BLUE SHIELD | Source: Ambulatory Visit | Attending: Obstetrics and Gynecology | Admitting: Obstetrics and Gynecology

## 2017-02-17 DIAGNOSIS — Z1231 Encounter for screening mammogram for malignant neoplasm of breast: Secondary | ICD-10-CM

## 2017-03-31 ENCOUNTER — Ambulatory Visit (INDEPENDENT_AMBULATORY_CARE_PROVIDER_SITE_OTHER): Payer: Self-pay | Admitting: Orthopaedic Surgery

## 2017-04-21 ENCOUNTER — Ambulatory Visit (INDEPENDENT_AMBULATORY_CARE_PROVIDER_SITE_OTHER): Payer: BLUE CROSS/BLUE SHIELD | Admitting: Physician Assistant

## 2017-04-21 ENCOUNTER — Ambulatory Visit (INDEPENDENT_AMBULATORY_CARE_PROVIDER_SITE_OTHER): Payer: Self-pay

## 2017-04-21 ENCOUNTER — Encounter (INDEPENDENT_AMBULATORY_CARE_PROVIDER_SITE_OTHER): Payer: Self-pay | Admitting: Physician Assistant

## 2017-04-21 DIAGNOSIS — G8929 Other chronic pain: Secondary | ICD-10-CM | POA: Diagnosis not present

## 2017-04-21 DIAGNOSIS — M5442 Lumbago with sciatica, left side: Secondary | ICD-10-CM | POA: Diagnosis not present

## 2017-04-21 MED ORDER — METHYLPREDNISOLONE 4 MG PO TABS: ORAL_TABLET | ORAL | 0 refills | Status: DC

## 2017-04-21 MED ORDER — CYCLOBENZAPRINE HCL 10 MG PO TABS: 10.0000 mg | ORAL_TABLET | Freq: Every day | ORAL | 0 refills | Status: AC
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
## 2017-05-23 ENCOUNTER — Ambulatory Visit (INDEPENDENT_AMBULATORY_CARE_PROVIDER_SITE_OTHER): Payer: BLUE CROSS/BLUE SHIELD | Admitting: Orthopaedic Surgery

## 2017-06-09 ENCOUNTER — Ambulatory Visit (INDEPENDENT_AMBULATORY_CARE_PROVIDER_SITE_OTHER): Payer: BLUE CROSS/BLUE SHIELD | Admitting: Orthopaedic Surgery

## 2017-06-09 ENCOUNTER — Other Ambulatory Visit (INDEPENDENT_AMBULATORY_CARE_PROVIDER_SITE_OTHER): Payer: Self-pay

## 2017-06-09 ENCOUNTER — Encounter (INDEPENDENT_AMBULATORY_CARE_PROVIDER_SITE_OTHER): Payer: Self-pay | Admitting: Orthopaedic Surgery

## 2017-06-09 DIAGNOSIS — M4807 Spinal stenosis, lumbosacral region: Secondary | ICD-10-CM

## 2017-06-09 DIAGNOSIS — G8929 Other chronic pain: Secondary | ICD-10-CM

## 2017-06-09 DIAGNOSIS — M5442 Lumbago with sciatica, left side: Secondary | ICD-10-CM

## 2017-06-09 MED ORDER — DICLOFENAC SODIUM 75 MG PO TBEC: 75.0000 mg | DELAYED_RELEASE_TABLET | Freq: Two times a day (BID) | ORAL | 1 refills | Status: DC

## 2017-06-09 MED ORDER — DIAZEPAM 5 MG PO TABS: ORAL_TABLET | ORAL | 0 refills | Status: DC
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
## 2017-06-23 ENCOUNTER — Ambulatory Visit (INDEPENDENT_AMBULATORY_CARE_PROVIDER_SITE_OTHER): Payer: BLUE CROSS/BLUE SHIELD | Admitting: Orthopaedic Surgery

## 2017-06-26 ENCOUNTER — Other Ambulatory Visit: Payer: BLUE CROSS/BLUE SHIELD

## 2017-07-04 ENCOUNTER — Ambulatory Visit (INDEPENDENT_AMBULATORY_CARE_PROVIDER_SITE_OTHER): Payer: BLUE CROSS/BLUE SHIELD | Admitting: Physician Assistant

## 2017-07-11 ENCOUNTER — Encounter (INDEPENDENT_AMBULATORY_CARE_PROVIDER_SITE_OTHER): Payer: Self-pay | Admitting: Orthopaedic Surgery

## 2017-07-11 ENCOUNTER — Ambulatory Visit (INDEPENDENT_AMBULATORY_CARE_PROVIDER_SITE_OTHER): Payer: BLUE CROSS/BLUE SHIELD | Admitting: Orthopaedic Surgery

## 2017-07-11 DIAGNOSIS — M5442 Lumbago with sciatica, left side: Secondary | ICD-10-CM | POA: Diagnosis not present

## 2017-07-11 DIAGNOSIS — G8929 Other chronic pain: Secondary | ICD-10-CM | POA: Diagnosis not present

## 2017-07-11 NOTE — Progress Notes (Signed)
The patient comes in today for follow-up after having an MRI of her lumbar spine.  She was having left-sided radicular symptoms with sciatica and pain that radiates into her foot with numbness and tingling in her left foot as well.  She is not a diabetic.  She denies any change in bowel bladder function.  She denies any weakness.  We have trialed conservative treatment of a steroid taper as well as activity modification and back extension exercises.  She still having the same symptoms.  Several years ago she did have a left-sided lumbar spine intervention by Dr. Ernestina Patches and that helped her significantly.  We sent her for a new MRI to see if anything is changed or if it is a different area as it relates to lumbar spine so we can set her up for another intervention with Dr. Ernestina Patches which she is definitely interested in  On examination she has some subjective numbness on the lateral aspect of her left leg and the dorsum and plantar aspect of her left foot but there is no weakness.  She has a mildly positive straight leg raise.  She has good muscle tone in her bilateral lower extremities and normal reflexes.  I only have the MRI report and not the actual disc but she understands to bring the disc to her appointment that we will set up with Dr. Ernestina Patches for a left-sided L5 injection.  In the past he did a intralaminar at L5-S1 based on her MRI findings certainly there may be a mild encroachment to the left of the L5 nerve root based on disc bulge and lateral recess stenosis.  She would like to try this intervention as well having had a successful injection in the past by Dr. Ernestina Patches.  She will still work on activity modification weight loss and anti-inflammatories in the interim we will work on getting this set up.  I will see her back myself in about 6 weeks to see how she is doing overall.  All questions concerns were answered and addressed.

## 2017-07-12 ENCOUNTER — Other Ambulatory Visit (INDEPENDENT_AMBULATORY_CARE_PROVIDER_SITE_OTHER): Payer: Self-pay

## 2017-07-12 DIAGNOSIS — M5442 Lumbago with sciatica, left side: Principal | ICD-10-CM

## 2017-07-12 DIAGNOSIS — G8929 Other chronic pain: Secondary | ICD-10-CM

## 2017-08-11 ENCOUNTER — Encounter (INDEPENDENT_AMBULATORY_CARE_PROVIDER_SITE_OTHER): Payer: Self-pay | Admitting: Physical Medicine and Rehabilitation

## 2017-08-11 ENCOUNTER — Ambulatory Visit (INDEPENDENT_AMBULATORY_CARE_PROVIDER_SITE_OTHER): Payer: BLUE CROSS/BLUE SHIELD | Admitting: Physical Medicine and Rehabilitation

## 2017-08-11 ENCOUNTER — Ambulatory Visit (INDEPENDENT_AMBULATORY_CARE_PROVIDER_SITE_OTHER): Payer: BLUE CROSS/BLUE SHIELD

## 2017-08-11 VITALS — BP 137/75 | HR 79 | Temp 98.6°F

## 2017-08-11 DIAGNOSIS — M5416 Radiculopathy, lumbar region: Secondary | ICD-10-CM

## 2017-08-11 MED ORDER — BETAMETHASONE SOD PHOS & ACET 6 (3-3) MG/ML IJ SUSP
12.0000 mg | Freq: Once | INTRAMUSCULAR | Status: AC
  Administered 2017-08-11: 12 mg

## 2017-08-11 NOTE — Patient Instructions (Signed)

## 2017-08-11 NOTE — Progress Notes (Signed)
  Numeric Pain Rating Scale and Functional Assessment Average Pain (5)   In the last MONTH (on 0-10 scale) has pain interfered with the following?  1. General activity like being  able to carry out your everyday physical activities such as walking, climbing stairs, carrying groceries, or moving a chair?  Rating(5)   +Driver, -BT, -Dye Allergies.  
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
## 2017-08-19 NOTE — Procedures (Signed)
Lumbar Epidural Steroid Injection - Interlaminar Approach with Fluoroscopic Guidance  Patient: [name]      Date of Birth: 1960/12/27 MRN: [id number] PCP: Aletha Halim., PA-C      Visit Date: 08/11/2017   Universal Protocol:     Consent Given By: the patient  Position: PRONE  Additional Comments: Vital signs were monitored before and after the procedure. Patient was prepped and draped in the usual sterile fashion. The correct patient, procedure, and site was verified.   Injection Procedure Details:  Procedure Site One Meds Administered:  Meds ordered this encounter  Medications  . betamethasone acetate-betamethasone sodium phosphate (CELESTONE) injection 12 mg     Laterality: Left  Location/Site:  L4-L5  Needle size: 20 G  Needle type: Tuohy  Needle Placement: Paramedian epidural  Findings:   -Comments: Initial flow of contrast after loss of resistance at L5-S1 appeared to be subdural.  The lateral view showed contrast pooling in the ventral epidural space and a pretty thick line in the AP view showed more of a dispersed pattern.  Further use of contrast did show pretty clear subdural spread.  There was no spinal fluid aspirated.  We did reposition the needle at L4-5 to get good epidural flow.  Procedure Details: Using a paramedian approach from the side mentioned above, the region overlying the inferior lamina was localized under fluoroscopic visualization and the soft tissues overlying this structure were infiltrated with 4 ml. of 1% Lidocaine without Epinephrine. The Tuohy needle was inserted into the epidural space using a paramedian approach.   The epidural space was localized using loss of resistance along with lateral and bi-planar fluoroscopic views.  After negative aspirate for air, blood, and CSF, a 2 ml. volume of Isovue-250 was injected into the epidural space and the flow of contrast was observed. Radiographs were obtained for documentation purposes.     The injectate was administered into the level noted above.   Additional Comments:   Dressing:     Post-procedure details: Patient was observed during the procedure. Post-procedure instructions were reviewed.  Patient left the clinic in stable condition.

## 2017-08-22 ENCOUNTER — Ambulatory Visit (INDEPENDENT_AMBULATORY_CARE_PROVIDER_SITE_OTHER): Payer: BLUE CROSS/BLUE SHIELD | Admitting: Orthopaedic Surgery

## 2017-08-22 ENCOUNTER — Encounter (INDEPENDENT_AMBULATORY_CARE_PROVIDER_SITE_OTHER): Payer: Self-pay | Admitting: Orthopaedic Surgery

## 2017-08-22 DIAGNOSIS — G8929 Other chronic pain: Secondary | ICD-10-CM

## 2017-08-22 DIAGNOSIS — M5442 Lumbago with sciatica, left side: Secondary | ICD-10-CM | POA: Diagnosis not present

## 2017-08-22 MED ORDER — NABUMETONE 750 MG PO TABS: 750.0000 mg | ORAL_TABLET | Freq: Two times a day (BID) | ORAL | 1 refills | Status: AC | PRN

## 2017-08-22 NOTE — Progress Notes (Signed)
The patient is a very pleasant 57 year old female with moderate obesity who is now just under 2 weeks status post an epidural steroid injection to the left at L4-L5 by Dr. Ernestina Patches.  She says that is definitely helped her but her pain is not completely gone.  She has been doing a lot of heavy lifting at work and would like to have a note to restrict her lifting.  She still hurts on her backside but some of the other symptoms have gotten significantly better in terms of her radicular symptoms going down her left leg.  There is no weakness in her left lower extremity are negative straight leg raise.  She does have subjective numbness on the lateral aspect of her left leg which is not present on the right side.  She still hurts some in the sciatic region.  At this point she would definitely benefit from outpatient physical therapy to work on various modalities to help decrease her sciatica.  This would include McKenzie exercises and traction.  I will send in Relafen as an anti-inflammatory to her pharmacy.  All questions concerns were answered and addressed.  We will see her back in 4 weeks see how she is doing overall.  I did give her a note to limit lifting no greater than 10 pounds at her job for the next 6 weeks.

## 2017-08-23 ENCOUNTER — Other Ambulatory Visit (INDEPENDENT_AMBULATORY_CARE_PROVIDER_SITE_OTHER): Payer: Self-pay

## 2017-08-23 DIAGNOSIS — G8929 Other chronic pain: Secondary | ICD-10-CM

## 2017-08-23 DIAGNOSIS — M5442 Lumbago with sciatica, left side: Principal | ICD-10-CM

## 2017-09-15 ENCOUNTER — Encounter: Payer: Self-pay | Admitting: Physical Therapy

## 2017-09-15 ENCOUNTER — Ambulatory Visit: Payer: BLUE CROSS/BLUE SHIELD | Attending: Orthopaedic Surgery | Admitting: Physical Therapy

## 2017-09-15 DIAGNOSIS — M5416 Radiculopathy, lumbar region: Secondary | ICD-10-CM | POA: Insufficient documentation

## 2017-09-15 DIAGNOSIS — G8929 Other chronic pain: Secondary | ICD-10-CM

## 2017-09-15 DIAGNOSIS — M5442 Lumbago with sciatica, left side: Secondary | ICD-10-CM | POA: Diagnosis not present
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
## 2017-09-20 ENCOUNTER — Encounter: Payer: Self-pay | Admitting: Physical Therapy

## 2017-09-20 ENCOUNTER — Ambulatory Visit: Payer: BLUE CROSS/BLUE SHIELD | Admitting: Physical Therapy

## 2017-09-20 DIAGNOSIS — M5416 Radiculopathy, lumbar region: Secondary | ICD-10-CM

## 2017-09-20 DIAGNOSIS — M5442 Lumbago with sciatica, left side: Principal | ICD-10-CM

## 2017-09-20 DIAGNOSIS — G8929 Other chronic pain: Secondary | ICD-10-CM
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
## 2017-09-22 ENCOUNTER — Ambulatory Visit (INDEPENDENT_AMBULATORY_CARE_PROVIDER_SITE_OTHER): Payer: BLUE CROSS/BLUE SHIELD | Admitting: Orthopaedic Surgery

## 2017-09-22 ENCOUNTER — Ambulatory Visit: Payer: BLUE CROSS/BLUE SHIELD | Admitting: Physical Therapy

## 2017-09-22 ENCOUNTER — Encounter: Payer: Self-pay | Admitting: Physical Therapy

## 2017-09-22 DIAGNOSIS — M5442 Lumbago with sciatica, left side: Principal | ICD-10-CM

## 2017-09-22 DIAGNOSIS — G8929 Other chronic pain: Secondary | ICD-10-CM

## 2017-09-22 DIAGNOSIS — M5416 Radiculopathy, lumbar region: Secondary | ICD-10-CM
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
## 2017-09-27 ENCOUNTER — Encounter: Payer: Self-pay | Admitting: Physical Therapy

## 2017-09-27 ENCOUNTER — Ambulatory Visit: Payer: BLUE CROSS/BLUE SHIELD | Admitting: Physical Therapy

## 2017-09-27 DIAGNOSIS — M5442 Lumbago with sciatica, left side: Principal | ICD-10-CM

## 2017-09-27 DIAGNOSIS — G8929 Other chronic pain: Secondary | ICD-10-CM

## 2017-09-27 DIAGNOSIS — M5416 Radiculopathy, lumbar region: Secondary | ICD-10-CM
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
## 2017-09-29 ENCOUNTER — Encounter: Payer: Self-pay | Admitting: Physical Therapy

## 2017-09-29 ENCOUNTER — Ambulatory Visit: Payer: BLUE CROSS/BLUE SHIELD | Admitting: Physical Therapy

## 2017-09-29 DIAGNOSIS — M5442 Lumbago with sciatica, left side: Principal | ICD-10-CM

## 2017-09-29 DIAGNOSIS — G8929 Other chronic pain: Secondary | ICD-10-CM

## 2017-09-29 DIAGNOSIS — M5416 Radiculopathy, lumbar region: Secondary | ICD-10-CM
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
## 2017-10-04 ENCOUNTER — Ambulatory Visit: Payer: BLUE CROSS/BLUE SHIELD | Admitting: Physical Therapy

## 2017-10-04 ENCOUNTER — Encounter: Payer: Self-pay | Admitting: Physical Therapy

## 2017-10-04 DIAGNOSIS — M5442 Lumbago with sciatica, left side: Secondary | ICD-10-CM | POA: Diagnosis not present

## 2017-10-04 DIAGNOSIS — M5416 Radiculopathy, lumbar region: Secondary | ICD-10-CM

## 2017-10-04 DIAGNOSIS — G8929 Other chronic pain: Secondary | ICD-10-CM
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
## 2017-10-06 ENCOUNTER — Ambulatory Visit: Payer: BLUE CROSS/BLUE SHIELD | Admitting: Physical Therapy

## 2017-10-06 ENCOUNTER — Encounter: Payer: Self-pay | Admitting: Physical Therapy

## 2017-10-06 DIAGNOSIS — G8929 Other chronic pain: Secondary | ICD-10-CM

## 2017-10-06 DIAGNOSIS — M5416 Radiculopathy, lumbar region: Secondary | ICD-10-CM

## 2017-10-06 DIAGNOSIS — M5442 Lumbago with sciatica, left side: Principal | ICD-10-CM
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
## 2017-10-11 ENCOUNTER — Encounter: Payer: Self-pay | Admitting: Physical Therapy

## 2017-10-11 ENCOUNTER — Ambulatory Visit: Payer: BLUE CROSS/BLUE SHIELD | Admitting: Physical Therapy

## 2017-10-11 DIAGNOSIS — M5416 Radiculopathy, lumbar region: Secondary | ICD-10-CM

## 2017-10-11 DIAGNOSIS — M5442 Lumbago with sciatica, left side: Secondary | ICD-10-CM | POA: Diagnosis not present

## 2017-10-11 DIAGNOSIS — G8929 Other chronic pain: Secondary | ICD-10-CM
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
## 2017-10-13 ENCOUNTER — Encounter: Payer: Self-pay | Admitting: Physical Therapy

## 2017-10-13 ENCOUNTER — Ambulatory Visit: Payer: BLUE CROSS/BLUE SHIELD | Admitting: Physical Therapy

## 2017-10-13 DIAGNOSIS — M5442 Lumbago with sciatica, left side: Principal | ICD-10-CM

## 2017-10-13 DIAGNOSIS — G8929 Other chronic pain: Secondary | ICD-10-CM

## 2017-10-13 DIAGNOSIS — M5416 Radiculopathy, lumbar region: Secondary | ICD-10-CM
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
## 2017-10-17 ENCOUNTER — Encounter (INDEPENDENT_AMBULATORY_CARE_PROVIDER_SITE_OTHER): Payer: Self-pay | Admitting: Orthopaedic Surgery

## 2017-10-17 ENCOUNTER — Ambulatory Visit (INDEPENDENT_AMBULATORY_CARE_PROVIDER_SITE_OTHER): Payer: BLUE CROSS/BLUE SHIELD | Admitting: Orthopaedic Surgery

## 2017-10-17 DIAGNOSIS — M5442 Lumbago with sciatica, left side: Secondary | ICD-10-CM

## 2017-10-17 DIAGNOSIS — G8929 Other chronic pain: Secondary | ICD-10-CM

## 2017-10-17 MED ORDER — TIZANIDINE HCL 4 MG PO TABS: 4.0000 mg | ORAL_TABLET | Freq: Three times a day (TID) | ORAL | 0 refills | Status: AC | PRN

## 2017-10-17 MED ORDER — ACETAMINOPHEN-CODEINE #3 300-30 MG PO TABS: 1.0000 | ORAL_TABLET | Freq: Three times a day (TID) | ORAL | 0 refills | Status: AC | PRN

## 2017-10-17 NOTE — Progress Notes (Signed)
The patient comes in for follow-up after having an intervention back in March of her lumbar spine by Dr. Ernestina Patches in the lower aspect lumbar spine to the left side.  She had an MRI that accompanied her that showed some recess stenosis in the lower aspect of her spine but no severe findings.  She continues to complain of significant sciatica.  She does have a BMI of almost 50.  She is been going to physical therapy and she said they have been working with her quite a bit but she still having severe pain enough that she took 1 of her husband's Percocet.  On exam she says difficult for her to sit due to pain but her hip exam on the left side on my exam is completely normal.  There is no significant weakness in the bilateral lower extremities either.  She would like to try at least one more an intervention by Dr. Ernestina Patches with an injection to the left side is likely L4-L5 versus L5-S1.  I will send in some Tylenol with codeine and a muscle relaxant and she will continue ibuprofen as well.  She will also continue physical therapy.

## 2017-10-18 ENCOUNTER — Other Ambulatory Visit (INDEPENDENT_AMBULATORY_CARE_PROVIDER_SITE_OTHER): Payer: Self-pay

## 2017-10-18 ENCOUNTER — Encounter: Payer: BLUE CROSS/BLUE SHIELD | Admitting: Physical Therapy

## 2017-10-18 DIAGNOSIS — M5442 Lumbago with sciatica, left side: Secondary | ICD-10-CM

## 2017-10-20 ENCOUNTER — Encounter: Payer: BLUE CROSS/BLUE SHIELD | Admitting: Physical Therapy

## 2017-10-28 ENCOUNTER — Telehealth (INDEPENDENT_AMBULATORY_CARE_PROVIDER_SITE_OTHER): Payer: Self-pay | Admitting: *Deleted

## 2017-10-31 ENCOUNTER — Other Ambulatory Visit (INDEPENDENT_AMBULATORY_CARE_PROVIDER_SITE_OTHER): Payer: Self-pay | Admitting: Physical Medicine and Rehabilitation

## 2017-10-31 DIAGNOSIS — F411 Generalized anxiety disorder: Secondary | ICD-10-CM

## 2017-10-31 MED ORDER — DIAZEPAM 5 MG PO TABS: ORAL_TABLET | ORAL | 0 refills | Status: AC

## 2017-10-31 NOTE — Progress Notes (Signed)
Pre-procedure valium 

## 2017-10-31 NOTE — Telephone Encounter (Signed)
Advised with Driver.

## 2017-10-31 NOTE — Telephone Encounter (Signed)
done

## 2017-11-23 ENCOUNTER — Other Ambulatory Visit (INDEPENDENT_AMBULATORY_CARE_PROVIDER_SITE_OTHER): Payer: Self-pay | Admitting: Physician Assistant

## 2017-11-28 ENCOUNTER — Encounter (INDEPENDENT_AMBULATORY_CARE_PROVIDER_SITE_OTHER): Payer: Self-pay | Admitting: Physical Medicine and Rehabilitation

## 2018-05-12 ENCOUNTER — Other Ambulatory Visit (INDEPENDENT_AMBULATORY_CARE_PROVIDER_SITE_OTHER): Payer: Self-pay | Admitting: Orthopaedic Surgery

## 2018-05-12 NOTE — Telephone Encounter (Signed)
Ok for refill? 

## 2018-08-30 ENCOUNTER — Other Ambulatory Visit (INDEPENDENT_AMBULATORY_CARE_PROVIDER_SITE_OTHER): Payer: Self-pay | Admitting: Orthopaedic Surgery

## 2019-04-24 ENCOUNTER — Other Ambulatory Visit: Payer: Self-pay | Admitting: Obstetrics and Gynecology

## 2019-04-24 DIAGNOSIS — Z1231 Encounter for screening mammogram for malignant neoplasm of breast: Secondary | ICD-10-CM

## 2019-05-02 ENCOUNTER — Other Ambulatory Visit: Payer: Self-pay

## 2019-05-02 ENCOUNTER — Ambulatory Visit
Admission: RE | Admit: 2019-05-02 | Discharge: 2019-05-02 | Disposition: A | Discharge status: Home / Self Care | Payer: BC Managed Care – PPO | Source: Ambulatory Visit | Attending: Obstetrics and Gynecology | Admitting: Obstetrics and Gynecology

## 2019-05-02 DIAGNOSIS — Z1231 Encounter for screening mammogram for malignant neoplasm of breast: Secondary | ICD-10-CM

## 2019-05-15 ENCOUNTER — Other Ambulatory Visit (INDEPENDENT_AMBULATORY_CARE_PROVIDER_SITE_OTHER): Payer: Self-pay | Admitting: Orthopaedic Surgery

## 2021-02-10 ENCOUNTER — Other Ambulatory Visit: Payer: Self-pay | Admitting: Family Medicine

## 2021-02-10 DIAGNOSIS — Z1231 Encounter for screening mammogram for malignant neoplasm of breast: Secondary | ICD-10-CM

## 2021-02-11 ENCOUNTER — Other Ambulatory Visit: Payer: Self-pay

## 2021-02-11 ENCOUNTER — Ambulatory Visit
Admission: RE | Admit: 2021-02-11 | Discharge: 2021-02-11 | Disposition: A | Discharge status: Home / Self Care | Payer: No Typology Code available for payment source | Source: Ambulatory Visit | Attending: Family Medicine | Admitting: Family Medicine

## 2021-02-11 DIAGNOSIS — Z1231 Encounter for screening mammogram for malignant neoplasm of breast: Secondary | ICD-10-CM

## 2022-03-24 ENCOUNTER — Ambulatory Visit: Payer: No Typology Code available for payment source | Admitting: Physical Therapy

## 2022-03-25 ENCOUNTER — Ambulatory Visit: Payer: No Typology Code available for payment source | Admitting: Physical Therapy
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
## 2022-07-06 ENCOUNTER — Other Ambulatory Visit: Payer: Self-pay

## 2022-07-06 ENCOUNTER — Encounter (HOSPITAL_COMMUNITY): Payer: Self-pay

## 2022-07-06 ENCOUNTER — Encounter (HOSPITAL_COMMUNITY)
Admission: RE | Admit: 2022-07-06 | Discharge: 2022-07-06 | Disposition: A | Discharge status: Home / Self Care | Payer: 59 | Source: Ambulatory Visit | Attending: Optometry | Admitting: Optometry

## 2022-07-06 HISTORY — DX: Unspecified osteoarthritis, unspecified site: M19.90

## 2022-07-09 ENCOUNTER — Ambulatory Visit (HOSPITAL_COMMUNITY): Payer: 59 | Admitting: Anesthesiology

## 2022-07-09 ENCOUNTER — Ambulatory Visit (HOSPITAL_BASED_OUTPATIENT_CLINIC_OR_DEPARTMENT_OTHER): Payer: 59 | Admitting: Anesthesiology

## 2022-07-09 ENCOUNTER — Ambulatory Visit (HOSPITAL_COMMUNITY)
Admission: RE | Admit: 2022-07-09 | Discharge: 2022-07-09 | Disposition: A | Discharge status: Home / Self Care | Payer: 59 | Source: Ambulatory Visit | Attending: Optometry | Admitting: Optometry

## 2022-07-09 ENCOUNTER — Encounter (HOSPITAL_COMMUNITY)
Admission: RE | Disposition: A | Discharge status: Home / Self Care | Payer: Self-pay | Source: Ambulatory Visit | Attending: Optometry

## 2022-07-09 ENCOUNTER — Encounter (HOSPITAL_COMMUNITY): Payer: Self-pay | Admitting: Optometry

## 2022-07-09 DIAGNOSIS — Z6841 Body Mass Index (BMI) 40.0 and over, adult: Secondary | ICD-10-CM

## 2022-07-09 DIAGNOSIS — E039 Hypothyroidism, unspecified: Secondary | ICD-10-CM | POA: Insufficient documentation

## 2022-07-09 DIAGNOSIS — H2512 Age-related nuclear cataract, left eye: Secondary | ICD-10-CM | POA: Insufficient documentation

## 2022-07-09 DIAGNOSIS — H02834 Dermatochalasis of left upper eyelid: Secondary | ICD-10-CM | POA: Diagnosis not present

## 2022-07-09 DIAGNOSIS — H0289 Other specified disorders of eyelid: Secondary | ICD-10-CM | POA: Diagnosis not present

## 2022-07-09 DIAGNOSIS — K219 Gastro-esophageal reflux disease without esophagitis: Secondary | ICD-10-CM | POA: Insufficient documentation

## 2022-07-09 DIAGNOSIS — H5201 Hypermetropia, right eye: Secondary | ICD-10-CM | POA: Diagnosis not present

## 2022-07-09 DIAGNOSIS — H02831 Dermatochalasis of right upper eyelid: Secondary | ICD-10-CM | POA: Diagnosis not present

## 2022-07-09 DIAGNOSIS — H25812 Combined forms of age-related cataract, left eye: Secondary | ICD-10-CM

## 2022-07-09 HISTORY — PX: CATARACT EXTRACTION W/PHACO: SHX586

## 2022-07-09 SURGERY — PHACOEMULSIFICATION, CATARACT, WITH IOL INSERTION
Anesthesia: Monitor Anesthesia Care | Site: Eye | Laterality: Left

## 2022-07-09 MED ORDER — PHENYLEPHRINE-KETOROLAC 1-0.3 % IO SOLN
INTRAOCULAR | Status: DC | PRN
  Administered 2022-07-09: 500 mL via OPHTHALMIC

## 2022-07-09 MED ORDER — STERILE WATER FOR IRRIGATION IR SOLN
Status: DC | PRN
  Administered 2022-07-09: 250 mL

## 2022-07-09 MED ORDER — TETRACAINE HCL 0.5 % OP SOLN
1.0000 [drp] | OPHTHALMIC | Status: AC | PRN
  Administered 2022-07-09 (×3): 1 [drp] via OPHTHALMIC

## 2022-07-09 MED ORDER — FENTANYL CITRATE (PF) 100 MCG/2ML IJ SOLN
INTRAMUSCULAR | Status: AC
  Filled 2022-07-09: qty 2

## 2022-07-09 MED ORDER — LIDOCAINE HCL 3.5 % OP GEL
1.0000 | Freq: Once | OPHTHALMIC | Status: AC
  Administered 2022-07-09: 1 via OPHTHALMIC

## 2022-07-09 MED ORDER — SIGHTPATH DOSE#1 NA HYALUR & NA CHOND-NA HYALUR IO KIT
PACK | INTRAOCULAR | Status: DC | PRN
  Administered 2022-07-09: 1 via OPHTHALMIC

## 2022-07-09 MED ORDER — NEOMYCIN-POLYMYXIN-DEXAMETH 3.5-10000-0.1 OP SUSP
OPHTHALMIC | Status: DC | PRN
  Administered 2022-07-09: 1 [drp] via OPHTHALMIC

## 2022-07-09 MED ORDER — FENTANYL CITRATE (PF) 100 MCG/2ML IJ SOLN
INTRAMUSCULAR | Status: DC | PRN
  Administered 2022-07-09: 50 ug via INTRAVENOUS

## 2022-07-09 MED ORDER — BSS IO SOLN
INTRAOCULAR | Status: DC | PRN
  Administered 2022-07-09: 15 mL via INTRAOCULAR

## 2022-07-09 MED ORDER — MIDAZOLAM HCL 2 MG/2ML IJ SOLN
INTRAMUSCULAR | Status: AC
  Filled 2022-07-09: qty 2

## 2022-07-09 MED ORDER — LIDOCAINE HCL (PF) 1 % IJ SOLN
INTRAMUSCULAR | Status: DC | PRN
  Administered 2022-07-09: 1 mL

## 2022-07-09 MED ORDER — SODIUM CHLORIDE 0.9% FLUSH
INTRAVENOUS | Status: DC | PRN
  Administered 2022-07-09 (×2): 5 mL via INTRAVENOUS

## 2022-07-09 MED ORDER — PHENYLEPHRINE HCL 2.5 % OP SOLN
1.0000 [drp] | OPHTHALMIC | Status: AC | PRN
  Administered 2022-07-09 (×3): 1 [drp] via OPHTHALMIC

## 2022-07-09 MED ORDER — MIDAZOLAM HCL 5 MG/5ML IJ SOLN
INTRAMUSCULAR | Status: DC | PRN
  Administered 2022-07-09: .5 mg via INTRAVENOUS
  Administered 2022-07-09: 1 mg via INTRAVENOUS

## 2022-07-09 MED ORDER — POVIDONE-IODINE 5 % OP SOLN
OPHTHALMIC | Status: DC | PRN
  Administered 2022-07-09: 1 via OPHTHALMIC

## 2022-07-09 MED ORDER — TROPICAMIDE 1 % OP SOLN
1.0000 [drp] | OPHTHALMIC | Status: AC | PRN
  Administered 2022-07-09 (×3): 1 [drp] via OPHTHALMIC

## 2022-07-09 SURGICAL SUPPLY — 16 items
CATARACT SUITE SIGHTPATH (MISCELLANEOUS) ×1 IMPLANT
CLOTH BEACON ORANGE TIMEOUT ST (SAFETY) ×2 IMPLANT
DRSG TEGADERM 4X4.75 (GAUZE/BANDAGES/DRESSINGS) ×2 IMPLANT
EYE SHIELD UNIVERSAL CLEAR (GAUZE/BANDAGES/DRESSINGS) IMPLANT
FEE CATARACT SUITE SIGHTPATH (MISCELLANEOUS) ×2 IMPLANT
GLOVE BIOGEL PI IND STRL 7.0 (GLOVE) ×4 IMPLANT
LENS IOL RAYNER 24.0 (Intraocular Lens) ×1 IMPLANT
LENS IOL RAYONE EMV 24.0 (Intraocular Lens) IMPLANT
NDL HYPO 18GX1.5 BLUNT FILL (NEEDLE) ×2 IMPLANT
NEEDLE HYPO 18GX1.5 BLUNT FILL (NEEDLE) ×1 IMPLANT
PAD ARMBOARD 7.5X6 YLW CONV (MISCELLANEOUS) ×2 IMPLANT
RING MALYGIN 7.0 (MISCELLANEOUS) IMPLANT
SYR TB 1ML LL NO SAFETY (SYRINGE) ×2 IMPLANT
TAPE SURG TRANSPORE 1 IN (GAUZE/BANDAGES/DRESSINGS) IMPLANT
TAPE SURGICAL TRANSPORE 1 IN (GAUZE/BANDAGES/DRESSINGS) ×1
WATER STERILE IRR 250ML POUR (IV SOLUTION) ×2 IMPLANT

## 2022-07-09 NOTE — Interval H&P Note (Signed)
History and Physical Interval Note:  07/09/2022 11:43 AM  The H and P was reviewed and updated. The patient was examined.  No changes were found after exam.  The surgical eye was marked.  [name]

## 2022-07-09 NOTE — Anesthesia Procedure Notes (Signed)
Procedure Name: MAC Date/Time: 07/09/2022 12:11 PM  Performed by: Maude Leriche, CRNAPre-anesthesia Checklist: Patient identified, Emergency Drugs available, Suction available, Patient being monitored and Timeout performed Patient Re-evaluated:Patient Re-evaluated prior to induction Oxygen Delivery Method: Nasal cannula Placement Confirmation: positive ETCO2 Dental Injury: Teeth and Oropharynx as per pre-operative assessment

## 2022-07-09 NOTE — Anesthesia Postprocedure Evaluation (Signed)
Anesthesia Post Note  Patient: [name]  Procedure(s) Performed: CATARACT EXTRACTION PHACO AND INTRAOCULAR LENS PLACEMENT (IOC) (Left: Eye)  Patient location during evaluation: Phase II Anesthesia Type: MAC Level of consciousness: awake and alert and oriented Pain management: pain level controlled Vital Signs Assessment: post-procedure vital signs reviewed and stable Respiratory status: spontaneous breathing, nonlabored ventilation and respiratory function stable Cardiovascular status: stable and blood pressure returned to baseline Postop Assessment: no apparent nausea or vomiting Anesthetic complications: no  No notable events documented.   Last Vitals:  Vitals:   07/09/22 1148 07/09/22 1233  BP: 133/67 129/79  Pulse:  65  Resp: 13 10  Temp: 36.7 C 36.7 C  SpO2: 97% 98%    Last Pain:  Vitals:   07/09/22 1233  TempSrc: Oral  PainSc: 0-No pain                 Modesty Rudy C [name]

## 2022-07-09 NOTE — Discharge Instructions (Signed)
Please discharge patient when stable, will follow up today with Dr. Hillary Schwegler at the [facility] [place] office immediately following discharge.  Leave shield in place until visit.  All paperwork with discharge instructions will be given at the office.  [facility] Address:  [address]  [place], [place] [address]  Dr. Dalores Weger's Phone: [phone]  

## 2022-07-09 NOTE — Transfer of Care (Signed)
Immediate Anesthesia Transfer of Care Note  Patient: [name]  Procedure(s) Performed: CATARACT EXTRACTION PHACO AND INTRAOCULAR LENS PLACEMENT (IOC) (Left: Eye)  Patient Location: PACU  Anesthesia Type:MAC  Level of Consciousness: awake, alert , and oriented  Airway & Oxygen Therapy: Patient Spontanous Breathing  Post-op Assessment: Report given to RN, Post -op Vital signs reviewed and stable, Patient moving all extremities X 4, and Patient able to stick tongue midline  Post vital signs: Reviewed  Last Vitals:  Vitals Value Taken Time  BP 129/79 07/09/22 1233  Temp 36.7 C 07/09/22 1233  Pulse 65 07/09/22 1233  Resp 10 07/09/22 1233  SpO2 98 % 07/09/22 1233    Last Pain:  Vitals:   07/09/22 1233  TempSrc: Oral  PainSc: 0-No pain      Patients Stated Pain Goal: 7 ([id number])  Complications: No notable events documented.

## 2022-07-09 NOTE — Anesthesia Preprocedure Evaluation (Signed)
Anesthesia Evaluation  Patient identified by MRN, date of birth, ID band Patient awake    Reviewed: Allergy & Precautions, H&P , NPO status , Patient's Chart, lab work & pertinent test results  History of Anesthesia Complications (+) PONV and history of anesthetic complications  Airway Mallampati: II  TM Distance: >3 FB Neck ROM: Full    Dental  (+) Dental Advisory Given, Teeth Intact   Pulmonary asthma , pneumonia   Pulmonary exam normal breath sounds clear to auscultation       Cardiovascular negative cardio ROS Normal cardiovascular exam Rhythm:Regular Rate:Normal     Neuro/Psych negative neurological ROS  negative psych ROS   GI/Hepatic Neg liver ROS,GERD  Medicated and Controlled,,  Endo/Other  Hypothyroidism  Morbid obesity  Renal/GU negative Renal ROS  negative genitourinary   Musculoskeletal negative musculoskeletal ROS (+)    Abdominal   Peds negative pediatric ROS (+)  Hematology negative hematology ROS (+)   Anesthesia Other Findings   Reproductive/Obstetrics negative OB ROS                             Anesthesia Physical Anesthesia Plan  ASA: 3  Anesthesia Plan: MAC   Post-op Pain Management: Minimal or no pain anticipated   Induction:   PONV Risk Score and Plan: 1 and Treatment may vary due to age or medical condition  Airway Management Planned: Nasal Cannula and Natural Airway  Additional Equipment:   Intra-op Plan:   Post-operative Plan:   Informed Consent: I have reviewed the patients History and Physical, chart, labs and discussed the procedure including the risks, benefits and alternatives for the proposed anesthesia with the patient or authorized representative who has indicated his/her understanding and acceptance.     Dental advisory given  Plan Discussed with: CRNA and Surgeon  Anesthesia Plan Comments:        Anesthesia Quick  Evaluation

## 2022-07-09 NOTE — Op Note (Signed)
Date of procedure: 07/09/22  Pre-operative diagnosis: Visually significant age-related nuclear cataract, Left Eye (H25.12)  Post-operative diagnosis: Visually significant age-related nuclear cataract, Left Eye  Procedure: Removal of cataract via phacoemulsification and insertion of intra-ocular lens Rayner [id number] +24.0D into the capsular bag of the Left Eye  Attending surgeon: Bryon Lions, MD  Anesthesia: MAC, Topical Akten  Complications: None  Estimated Blood Loss: <50m (minimal)  Specimens: None  Implants:  Implant Name Type Inv. Item Serial No. Manufacturer Lot No. LRB No. Used Action  LENS IOL RAYNER 24.0 - S41 Intraocular Lens LENS IOL RAYNER 24.0 41 SIGHTPATH [id number] Implanted    Indications:  Visually significant age-related cataract, Left Eye  Procedure:  The patient was seen and identified in the pre-operative area. The operative eye was identified and dilated.  The operative eye was marked.  Topical anesthesia was administered to the operative eye.     The patient was then to the operative suite and placed in the supine position.  A timeout was performed confirming the patient, procedure to be performed, and all other relevant information.   The patient's face was prepped and draped in the usual fashion for intra-ocular surgery.  A lid speculum was placed into the operative eye and the surgical microscope moved into place and focused.  An inferotemporal paracentesis was created using a 20 gauge paracentesis blade.  BSS mixed with Omidria, followed by 1% lidocaine was injected into the anterior chamber.  Viscoelastic was injected into the anterior chamber.  A temporal clear-corneal main wound incision was created using a 2.461mmicrokeratome.  A continuous curvilinear capsulorrhexis was initiated using an irrigating cystitome and completed using capsulorrhexis forceps.  Hydrodissection and hydrodeliniation were performed.  Viscoelastic was injected into the  anterior chamber.  A phacoemulsification handpiece and a chopper as a second instrument were used to remove the nucleus and epinucleus. The irrigation/aspiration handpiece was used to remove any remaining cortical material.   The capsular bag was reinflated with viscoelastic, checked, and found to be intact.  The intraocular lens was inserted into the capsular bag.  The irrigation/aspiration handpiece was used to remove any remaining viscoelastic.  The clear corneal wound and paracentesis wounds were then hydrated and checked with Weck-Cels to be watertight.  The lid-speculum and drape was removed, and the patient's face was cleaned with a wet and dry 4x4.  Moxifloxacin was instilled onto the eye. A clear shield was taped over the eye. The patient was taken to the post-operative care unit in good condition, having tolerated the procedure well.  Post-Op Instructions: [facility] and will receive all other orders and instructions.

## 2022-07-19 ENCOUNTER — Encounter (HOSPITAL_COMMUNITY): Payer: Self-pay | Admitting: Optometry

## 2022-08-18 NOTE — H&P (Signed)
Surgical History & Physical  Patient Name: [name]  DOB: 1961/04/14  Surgery: Cataract extraction with intraocular lens implant phacoemulsification; Right Eye Surgeon: Ronn Melena MD Surgery Date: 08/27/2022 Pre-Op Date: 07/20/2022  HPI: A 81 Yr. old female patient is here for 5 day PO-OS.  Pt. is still experiencing blurry vision in OD and glare during the day and at night. This makes if difficult to drive. This is negatively affecting the patient's quality of life and the patient is unable to function adequately in life with the current level of vision. Patient is ready to proceed with surgery. HPI was performed by Ronn Melena .  Medical History: Cataracts Cancer GERD Lung Problems Thyroid Problems  Review of Systems Endocrine Hypothyroidism All recorded systems are negative except as noted above.  Social Never smoked   Medication Prednisolone-Moxifloxacin-Bromfenac,  Levothyroxine, Omeprazole, Bio complete 3  Sx/Procedures Phaco c IOL OS,  Shoulder surgery, Carpel Tunnel, Gallbladder Sx, Hernia Sx, Hysterectomy  Drug Allergies  NKDA Cancer  History & Physical: Heent: PCIOL OS, cataract OD NECK: supple without bruits LUNGS: lungs clear to auscultation CV: regular rate and rhythm Abdomen: soft and non-tender  Impression & Plan: Assessment: 1.  CATARACT EXTRACTION STATUS; Left Eye (Z98.42) 2.  INTRAOCULAR LENS IOL ; Left Eye (Z96.1) 3.  CATARACT AGE-RELATED NUCLEAR; Both Eyes (H25.13)  Plan: 1.  POW1.5. Doing well, Cornea compact. All post-op precautions discussed and instructions reviewed. Written instructions given.  2. See above  3.  Cataracts are visually significant and account for the patient's complaints. Discussed all risks, benefits, procedures and recovery, including infection, loss of vision and eye, need for glasses after surgery or additional procedures. Patient understands changing glasses will not improve vision. Patient indicated  understanding of procedure. All questions answered. Patient desires to have surgery, recommend phacoemulsification with intraocular lens. Patient to have preliminary testing necessary (Argos/IOL Master, Mac OCT, TOPO) Educational materials provided:. Plan: - Proceed with cataract surgery OD - target -0.25 - claustrophobic - no DM, no fuchs, moderate dilation - Omidria, combo drop or 3 separate

## 2022-08-24 ENCOUNTER — Encounter (HOSPITAL_COMMUNITY)
Admission: RE | Admit: 2022-08-24 | Discharge: 2022-08-24 | Disposition: A | Discharge status: Home / Self Care | Payer: 59 | Source: Ambulatory Visit | Attending: Optometry | Admitting: Optometry

## 2022-08-24 ENCOUNTER — Encounter (HOSPITAL_COMMUNITY): Payer: Self-pay

## 2022-08-24 ENCOUNTER — Other Ambulatory Visit: Payer: Self-pay

## 2022-08-27 ENCOUNTER — Ambulatory Visit (HOSPITAL_BASED_OUTPATIENT_CLINIC_OR_DEPARTMENT_OTHER): Payer: 59 | Admitting: Certified Registered"

## 2022-08-27 ENCOUNTER — Ambulatory Visit (HOSPITAL_COMMUNITY): Payer: 59 | Admitting: Certified Registered"

## 2022-08-27 ENCOUNTER — Encounter (HOSPITAL_COMMUNITY)
Admission: RE | Disposition: A | Discharge status: Home / Self Care | Payer: Self-pay | Source: Home / Self Care | Attending: Optometry

## 2022-08-27 ENCOUNTER — Ambulatory Visit (HOSPITAL_COMMUNITY)
Admission: RE | Admit: 2022-08-27 | Discharge: 2022-08-27 | Disposition: A | Discharge status: Home / Self Care | Payer: 59 | Attending: Optometry | Admitting: Optometry

## 2022-08-27 DIAGNOSIS — Z9842 Cataract extraction status, left eye: Secondary | ICD-10-CM | POA: Insufficient documentation

## 2022-08-27 DIAGNOSIS — E039 Hypothyroidism, unspecified: Secondary | ICD-10-CM | POA: Diagnosis not present

## 2022-08-27 DIAGNOSIS — Z6841 Body Mass Index (BMI) 40.0 and over, adult: Secondary | ICD-10-CM

## 2022-08-27 DIAGNOSIS — Z79899 Other long term (current) drug therapy: Secondary | ICD-10-CM | POA: Diagnosis not present

## 2022-08-27 DIAGNOSIS — J45909 Unspecified asthma, uncomplicated: Secondary | ICD-10-CM | POA: Insufficient documentation

## 2022-08-27 DIAGNOSIS — K219 Gastro-esophageal reflux disease without esophagitis: Secondary | ICD-10-CM | POA: Insufficient documentation

## 2022-08-27 DIAGNOSIS — H2511 Age-related nuclear cataract, right eye: Secondary | ICD-10-CM | POA: Diagnosis not present

## 2022-08-27 DIAGNOSIS — Z961 Presence of intraocular lens: Secondary | ICD-10-CM | POA: Insufficient documentation

## 2022-08-27 DIAGNOSIS — J189 Pneumonia, unspecified organism: Secondary | ICD-10-CM | POA: Diagnosis not present

## 2022-08-27 HISTORY — PX: CATARACT EXTRACTION W/PHACO: SHX586

## 2022-08-27 SURGERY — PHACOEMULSIFICATION, CATARACT, WITH IOL INSERTION
Anesthesia: Monitor Anesthesia Care | Site: Eye | Laterality: Right

## 2022-08-27 MED ORDER — LIDOCAINE HCL 3.5 % OP GEL
1.0000 | Freq: Once | OPHTHALMIC | Status: AC
  Administered 2022-08-27: 1 via OPHTHALMIC

## 2022-08-27 MED ORDER — TETRACAINE HCL 0.5 % OP SOLN
1.0000 [drp] | OPHTHALMIC | Status: AC
  Administered 2022-08-27 (×3): 1 [drp] via OPHTHALMIC

## 2022-08-27 MED ORDER — FENTANYL CITRATE (PF) 100 MCG/2ML IJ SOLN
INTRAMUSCULAR | Status: DC | PRN
  Administered 2022-08-27: 50 ug via INTRAVENOUS

## 2022-08-27 MED ORDER — MIDAZOLAM HCL 2 MG/2ML IJ SOLN
INTRAMUSCULAR | Status: DC | PRN
  Administered 2022-08-27: 1 mg via INTRAVENOUS

## 2022-08-27 MED ORDER — FENTANYL CITRATE (PF) 100 MCG/2ML IJ SOLN
INTRAMUSCULAR | Status: AC
  Filled 2022-08-27: qty 2

## 2022-08-27 MED ORDER — PHENYLEPHRINE-KETOROLAC 1-0.3 % IO SOLN
INTRAOCULAR | Status: DC | PRN
  Administered 2022-08-27: 500 mL via OPHTHALMIC

## 2022-08-27 MED ORDER — NEOMYCIN-POLYMYXIN-DEXAMETH 3.5-10000-0.1 OP SUSP
OPHTHALMIC | Status: DC | PRN
  Administered 2022-08-27: 2 [drp] via OPHTHALMIC

## 2022-08-27 MED ORDER — SIGHTPATH DOSE#1 NA HYALUR & NA CHOND-NA HYALUR IO KIT
PACK | INTRAOCULAR | Status: DC | PRN
  Administered 2022-08-27: 1 via OPHTHALMIC

## 2022-08-27 MED ORDER — MIDAZOLAM HCL 2 MG/2ML IJ SOLN
INTRAMUSCULAR | Status: AC
  Filled 2022-08-27: qty 2

## 2022-08-27 MED ORDER — STERILE WATER FOR IRRIGATION IR SOLN
Status: DC | PRN
  Administered 2022-08-27: 250 mL

## 2022-08-27 MED ORDER — LIDOCAINE HCL (PF) 1 % IJ SOLN
INTRAMUSCULAR | Status: DC | PRN
  Administered 2022-08-27: 1 mL

## 2022-08-27 MED ORDER — BSS IO SOLN
INTRAOCULAR | Status: DC | PRN
  Administered 2022-08-27: 15 mL via INTRAOCULAR

## 2022-08-27 MED ORDER — SODIUM CHLORIDE 0.9% FLUSH
INTRAVENOUS | Status: DC | PRN
  Administered 2022-08-27: 5 mL via INTRAVENOUS

## 2022-08-27 MED ORDER — POVIDONE-IODINE 5 % OP SOLN
OPHTHALMIC | Status: DC | PRN
  Administered 2022-08-27: 1 via OPHTHALMIC

## 2022-08-27 MED ORDER — TROPICAMIDE 1 % OP SOLN
1.0000 [drp] | OPHTHALMIC | Status: AC
  Administered 2022-08-27 (×3): 1 [drp] via OPHTHALMIC

## 2022-08-27 MED ORDER — PHENYLEPHRINE HCL 2.5 % OP SOLN
1.0000 [drp] | OPHTHALMIC | Status: AC
  Administered 2022-08-27 (×3): 1 [drp] via OPHTHALMIC

## 2022-08-27 SURGICAL SUPPLY — 15 items
CATARACT SUITE SIGHTPATH (MISCELLANEOUS) ×1 IMPLANT
CLOTH BEACON ORANGE TIMEOUT ST (SAFETY) ×2 IMPLANT
DRSG TEGADERM 4X4.75 (GAUZE/BANDAGES/DRESSINGS) ×2 IMPLANT
EYE SHIELD UNIVERSAL CLEAR (GAUZE/BANDAGES/DRESSINGS) IMPLANT
FEE CATARACT SUITE SIGHTPATH (MISCELLANEOUS) ×2 IMPLANT
GLOVE BIOGEL PI IND STRL 7.0 (GLOVE) ×4 IMPLANT
LENS IOL RAYNER 23.5 (Intraocular Lens) ×1 IMPLANT
LENS IOL RAYONE EMV 23.5 (Intraocular Lens) IMPLANT
NDL HYPO 18GX1.5 BLUNT FILL (NEEDLE) ×2 IMPLANT
NEEDLE HYPO 18GX1.5 BLUNT FILL (NEEDLE) ×1 IMPLANT
PAD ARMBOARD 7.5X6 YLW CONV (MISCELLANEOUS) ×2 IMPLANT
RING MALYGIN 7.0 (MISCELLANEOUS) IMPLANT
SYR TB 1ML LL NO SAFETY (SYRINGE) ×2 IMPLANT
TAPE SURG TRANSPORE 1 IN (GAUZE/BANDAGES/DRESSINGS) IMPLANT
WATER STERILE IRR 250ML POUR (IV SOLUTION) ×2 IMPLANT

## 2022-08-27 NOTE — Anesthesia Postprocedure Evaluation (Signed)
Anesthesia Post Note  Patient: Tyrell B Gibbons  Procedure(s) Performed: CATARACT EXTRACTION PHACO AND INTRAOCULAR LENS PLACEMENT (IOC) (Right: Eye)  Patient location during evaluation: Phase II Anesthesia Type: MAC Level of consciousness: awake Pain management: pain level controlled Vital Signs Assessment: post-procedure vital signs reviewed and stable Respiratory status: spontaneous breathing and respiratory function stable Cardiovascular status: blood pressure returned to baseline and stable Postop Assessment: no headache and no apparent nausea or vomiting Anesthetic complications: no Comments: Late entry   No notable events documented.   Last Vitals:  Vitals:   08/27/22 0951 08/27/22 1045  BP: 119/68 113/67  Pulse: 70 66  Resp: 16 14  Temp: 36.8 C 36.9 C  SpO2: 97% 99%    Last Pain:  Vitals:   08/27/22 1045  PainSc: 0-No pain                 Windell Norfolk

## 2022-08-27 NOTE — Anesthesia Procedure Notes (Signed)
Procedure Name: MAC Date/Time: 08/27/2022 10:27 AM  Performed by: Julian Reil, CRNAPre-anesthesia Checklist: Patient identified, Emergency Drugs available, Suction available and Patient being monitored Patient Re-evaluated:Patient Re-evaluated prior to induction Oxygen Delivery Method: Nasal cannula Placement Confirmation: positive ETCO2

## 2022-08-27 NOTE — Interval H&P Note (Signed)
History and Physical Interval Note:  08/27/2022 10:03 AM  The H and P was reviewed and updated. The patient was examined.  No changes were found after exam.  The surgical eye was marked.  [name]

## 2022-08-27 NOTE — Op Note (Addendum)
Date of procedure: 08/27/22  Pre-operative diagnosis: Visually significant age-related nuclear cataract, Right Eye (H25.11)  Post-operative diagnosis: Visually significant age-related nuclear cataract, Right Eye  Procedure: Removal of cataract via phacoemulsification and insertion of intra-ocular lens Rayner [id number] +23.5D into the capsular bag of the Right Eye  Attending surgeon: Pecolia Ades, MD  Anesthesia: MAC, Topical Akten  Complications: None  Estimated Blood Loss: <37mL (minimal)  Specimens: None  Implants:  Implant Name Type Inv. Item Serial No. Manufacturer Lot No. LRB No. Used Action  LENS IOL RAYNER 23.5 - S40 Intraocular Lens LENS IOL RAYNER 23.5 40 [employer]IGHT[employer] [id number] Right 1 Implanted    Indications:  Visually significant age-related cataract, Right Eye  Procedure:  The patient was seen and identified in the pre-operative area. The operative eye was identified and dilated.  The operative eye was marked.  Topical anesthesia was administered to the operative eye.     The patient was then to the operative suite and placed in the supine position.  A timeout was performed confirming the patient, procedure to be performed, and all other relevant information.   The patient's face was prepped and draped in the usual fashion for intra-ocular surgery.  A lid speculum was placed into the operative eye and the surgical microscope moved into place and focused.  A superotemporal paracentesis was created using a 20 gauge paracentesis blade.  BSS mixed with Omidria, followed by 1% lidocaine was injected into the anterior chamber.  Viscoelastic was injected into the anterior chamber.  A temporal clear-corneal main wound incision was created using a 2.34mm microkeratome.  A continuous curvilinear capsulorrhexis was initiated using an irrigating cystitome and completed using capsulorrhexis forceps.  Hydrodissection and hydrodeliniation were performed.  Viscoelastic was injected into the  anterior chamber.  A phacoemulsification handpiece and a chopper as a second instrument were used to remove the nucleus and epinucleus. The irrigation/aspiration handpiece was used to remove any remaining cortical material.   The capsular bag was reinflated with viscoelastic, checked, and found to be intact.  The intraocular lens was inserted into the capsular bag.  The irrigation/aspiration handpiece was used to remove any remaining viscoelastic.  The clear corneal wound and paracentesis wounds were then hydrated and checked with Weck-Cels to be watertight.  The lid-speculum and drape was removed, and the patient's face was cleaned with a wet and dry 4x4.  Maxitrol drops were instilled onto the eye. A clear shield was taped over the eye. The patient was taken to the post-operative care unit in good condition, having tolerated the procedure well.  Post-Op Instructions: The patient will follow up at [facility] for a same day post-operative evaluation and will receive all other orders and instructions.

## 2022-08-27 NOTE — Transfer of Care (Signed)
Immediate Anesthesia Transfer of Care Note  Patient: [name]  Procedure(s) Performed: CATARACT EXTRACTION PHACO AND INTRAOCULAR LENS PLACEMENT (IOC) (Right: Eye)  Patient Location: Short Stay  Anesthesia Type:MAC  Level of Consciousness: awake, alert , and oriented  Airway & Oxygen Therapy: Patient Spontanous Breathing  Post-op Assessment: Report given to RN and Post -op Vital signs reviewed and stable  Post vital signs: Reviewed and stable  Last Vitals:  Vitals Value Taken Time  BP    Temp    Pulse    Resp    SpO2      Last Pain:  Vitals:   08/27/22 0951  PainSc: 0-No pain         Complications: No notable events documented.

## 2022-08-27 NOTE — Anesthesia Preprocedure Evaluation (Signed)
Anesthesia Evaluation  Patient identified by MRN, date of birth, ID band Patient awake    Reviewed: Allergy & Precautions, H&P , NPO status , Patient's Chart, lab work & pertinent test results  History of Anesthesia Complications (+) PONV and history of anesthetic complications  Airway Mallampati: II  TM Distance: >3 FB Neck ROM: Full    Dental  (+) Dental Advisory Given, Teeth Intact   Pulmonary asthma , pneumonia   Pulmonary exam normal breath sounds clear to auscultation       Cardiovascular negative cardio ROS Normal cardiovascular exam Rhythm:Regular Rate:Normal     Neuro/Psych negative neurological ROS  negative psych ROS   GI/Hepatic Neg liver ROS,GERD  Medicated and Controlled,,  Endo/Other  Hypothyroidism  Morbid obesity  Renal/GU negative Renal ROS  negative genitourinary   Musculoskeletal negative musculoskeletal ROS (+)    Abdominal   Peds negative pediatric ROS (+)  Hematology negative hematology ROS (+)   Anesthesia Other Findings   Reproductive/Obstetrics negative OB ROS                             Anesthesia Physical Anesthesia Plan  ASA: 3  Anesthesia Plan: MAC   Post-op Pain Management: Minimal or no pain anticipated   Induction:   PONV Risk Score and Plan: 1 and Treatment may vary due to age or medical condition  Airway Management Planned: Nasal Cannula and Natural Airway  Additional Equipment:   Intra-op Plan:   Post-operative Plan:   Informed Consent: I have reviewed the patients History and Physical, chart, labs and discussed the procedure including the risks, benefits and alternatives for the proposed anesthesia with the patient or authorized representative who has indicated his/her understanding and acceptance.     Dental advisory given  Plan Discussed with: CRNA and Surgeon  Anesthesia Plan Comments:        Anesthesia Quick  Evaluation  

## 2022-08-27 NOTE — Discharge Instructions (Signed)
Please discharge patient when stable, will follow up today with Dr. Anshu Wehner at the [facility] [place] office immediately following discharge.  Leave shield in place until visit.  All paperwork with discharge instructions will be given at the office.  [facility] [place] Address:  [address]  [place],  [address]  Dr. Ardyce Heyer's Phone: [phone]  

## 2022-09-02 ENCOUNTER — Encounter (HOSPITAL_COMMUNITY): Payer: Self-pay | Admitting: Optometry

## 2022-10-14 ENCOUNTER — Other Ambulatory Visit: Payer: Self-pay | Admitting: Family Medicine

## 2022-10-14 DIAGNOSIS — Z1231 Encounter for screening mammogram for malignant neoplasm of breast: Secondary | ICD-10-CM

## 2022-11-03 ENCOUNTER — Ambulatory Visit
Admission: RE | Admit: 2022-11-03 | Discharge: 2022-11-03 | Disposition: A | Discharge status: Home / Self Care | Payer: BC Managed Care – PPO | Source: Ambulatory Visit | Attending: Family Medicine | Admitting: Family Medicine

## 2022-11-03 DIAGNOSIS — Z1231 Encounter for screening mammogram for malignant neoplasm of breast: Secondary | ICD-10-CM | POA: Diagnosis not present

## 2022-11-30 DIAGNOSIS — H524 Presbyopia: Secondary | ICD-10-CM | POA: Diagnosis not present

## 2022-12-02 DIAGNOSIS — L570 Actinic keratosis: Secondary | ICD-10-CM | POA: Diagnosis not present

## 2022-12-29 ENCOUNTER — Other Ambulatory Visit: Payer: Self-pay

## 2022-12-29 ENCOUNTER — Emergency Department (HOSPITAL_COMMUNITY): Payer: BC Managed Care – PPO

## 2022-12-29 ENCOUNTER — Emergency Department (HOSPITAL_COMMUNITY)
Admission: EM | Admit: 2022-12-29 | Discharge: 2022-12-30 | Disposition: A | Discharge status: Home / Self Care | Payer: BC Managed Care – PPO | Attending: Emergency Medicine | Admitting: Emergency Medicine

## 2022-12-29 ENCOUNTER — Encounter (HOSPITAL_COMMUNITY): Payer: Self-pay

## 2022-12-29 DIAGNOSIS — R0789 Other chest pain: Secondary | ICD-10-CM | POA: Diagnosis not present

## 2022-12-29 DIAGNOSIS — R519 Headache, unspecified: Secondary | ICD-10-CM | POA: Diagnosis not present

## 2022-12-29 DIAGNOSIS — R911 Solitary pulmonary nodule: Secondary | ICD-10-CM | POA: Diagnosis not present

## 2022-12-29 DIAGNOSIS — R200 Anesthesia of skin: Secondary | ICD-10-CM | POA: Diagnosis not present

## 2022-12-29 DIAGNOSIS — R918 Other nonspecific abnormal finding of lung field: Secondary | ICD-10-CM | POA: Diagnosis not present

## 2022-12-29 DIAGNOSIS — J45909 Unspecified asthma, uncomplicated: Secondary | ICD-10-CM | POA: Diagnosis not present

## 2022-12-29 DIAGNOSIS — M62830 Muscle spasm of back: Secondary | ICD-10-CM | POA: Diagnosis not present

## 2022-12-29 DIAGNOSIS — M549 Dorsalgia, unspecified: Secondary | ICD-10-CM | POA: Diagnosis not present

## 2022-12-29 DIAGNOSIS — R079 Chest pain, unspecified: Secondary | ICD-10-CM | POA: Diagnosis not present

## 2022-12-29 LAB — CBC
HCT: 39.7 % (ref 36.0–46.0)
Hemoglobin: 12.6 g/dL (ref 12.0–15.0)
MCH: 29.3 pg (ref 26.0–34.0)
MCHC: 31.7 g/dL (ref 30.0–36.0)
MCV: 92.3 fL (ref 80.0–100.0)
Platelets: 298 10*3/uL (ref 150–400)
RBC: 4.3 MIL/uL (ref 3.87–5.11)
RDW: 13.6 % (ref 11.5–15.5)
WBC: 5 10*3/uL (ref 4.0–10.5)
nRBC: 0 % (ref 0.0–0.2)

## 2022-12-29 LAB — BASIC METABOLIC PANEL
Anion gap: 17 — ABNORMAL HIGH (ref 5–15)
BUN: 7 mg/dL — ABNORMAL LOW (ref 8–23)
CO2: 23 mmol/L (ref 22–32)
Calcium: 9 mg/dL (ref 8.9–10.3)
Chloride: 100 mmol/L (ref 98–111)
Creatinine, Ser: 0.77 mg/dL (ref 0.44–1.00)
GFR, Estimated: >60 mL/min (ref >60)
Glucose, Bld: 93 mg/dL (ref 70–99)
Potassium: 4 mmol/L (ref 3.5–5.1)
Sodium: 140 mmol/L (ref 135–145)

## 2022-12-29 LAB — TROPONIN I (HIGH SENSITIVITY)
Troponin I (High Sensitivity): 3 ng/L (ref <18)
Troponin I (High Sensitivity): 3 ng/L (ref <18)

## 2022-12-29 NOTE — ED Triage Notes (Signed)
Pt reports central chest tightness across her chest associated with lightheadedness, back pain, headache and tingling to right arm and pain in her right wrist; onset today around 10am.

## 2022-12-30 ENCOUNTER — Emergency Department (HOSPITAL_COMMUNITY): Payer: BC Managed Care – PPO

## 2022-12-30 ENCOUNTER — Other Ambulatory Visit: Payer: Self-pay

## 2022-12-30 DIAGNOSIS — R911 Solitary pulmonary nodule: Secondary | ICD-10-CM | POA: Diagnosis not present

## 2022-12-30 NOTE — Discharge Instructions (Signed)
You were evaluated tonight for chest pain.  Your workup was reassuring.  The CT scan of your chest showed no acute abnormalities.  The cause of your pain is unclear at this time. I have placed a referral with cardiology.  If your chest pain returns, you develop shortness of breath, or develop other life-threatening symptoms please return to the emergency department.

## 2022-12-30 NOTE — ED Provider Notes (Signed)
[place] EMERGENCY DEPARTMENT AT [facility] Provider Note   CSN: [id number] Arrival date & time: 12/29/22  1841     History  Chief Complaint  Patient presents with   Chest Pain    [name] is a 62 y.o. female.  Patient presents to the emergency department complaining of centralized chest tightness.  Patient states she had chest tightness across her chest which began yesterday morning at approximately 10 AM.  Patient also complained of some numbness in the right arm and pain in her right wrist.  Patient states that for the past few weeks she has had intermittent muscle spasms in her back as well.  She denies any shortness of breath, palpitations, abdominal pain, nausea, vomiting.  Past medical history significant for IBS, GERD, pneumonia Patient states all of her symptoms subsided by 2 AM.  HPI     Home Medications Prior to Admission medications   Medication Sig Start Date End Date Taking? Authorizing Provider  acetaminophen-codeine (TYLENOL #3) 300-30 MG tablet Take 1-2 tablets by mouth every 8 (eight) hours as needed for moderate pain. 10/17/17   Kathryne Hitch, MD  albuterol (PROVENTIL HFA;VENTOLIN HFA) 108 (90 BASE) MCG/ACT inhaler Inhale 2 puffs into the lungs every 6 (six) hours as needed. Shortness of breath      [provider]  cetirizine (ZYRTEC) 10 MG chewable tablet Chew 10 mg by mouth daily.    [provider]  cyclobenzaprine (FLEXERIL) 10 MG tablet Take 1 tablet (10 mg total) by mouth at bedtime. 04/21/17   Kirtland Bouchard, PA-C  diazepam (VALIUM) 5 MG tablet Take 1 by mouth 1 to 2 hours pre-procedure. May repeat if necessary. 10/31/17   Tyrell Antonio, MD  diclofenac (VOLTAREN) 75 MG EC tablet Take 1 tablet by mouth twice daily 05/16/19   Kathryne Hitch, MD  diphenhydrAMINE (BENADRYL) 25 MG tablet Take 25 mg by mouth every 4 (four) hours as needed for allergies.    [provider]  Docusate Calcium (STOOL  SOFTENER PO) Take 1 tablet by mouth 2 (two) times daily as needed (constipation).    [provider]  esomeprazole (NEXIUM) 40 MG capsule Take 40 mg by mouth daily before breakfast.    [provider]  fexofenadine (ALLEGRA) 180 MG tablet Take 180 mg by mouth daily.    [provider]  HYDROcodone-acetaminophen (NORCO) 5-325 MG per tablet Take 1-2 tablets by mouth every 4 (four) hours as needed. 08/10/13   Abigail Miyamoto, MD  hydrOXYzine (ATARAX/VISTARIL) 25 MG tablet  08/16/13   [provider]  ibuprofen (ADVIL,MOTRIN) 200 MG tablet Take 400 mg by mouth every 6 (six) hours as needed.    [provider]  levothyroxine (SYNTHROID, LEVOTHROID) 75 MCG tablet Take 75 mcg by mouth daily.    [provider]  nabumetone (RELAFEN) 750 MG tablet Take 1 tablet (750 mg total) by mouth 2 (two) times daily as needed. 08/22/17   Kathryne Hitch, MD  promethazine (PHENERGAN) 12.5 MG tablet Take 1-2 tablets (12.5-25 mg total) by mouth every 6 (six) hours as needed for nausea. 08/12/13   Karie Soda, MD  promethazine (PHENERGAN) 25 MG suppository Place 1 suppository (25 mg total) rectally every 6 (six) hours as needed for nausea. 08/12/13   Karie Soda, MD  pseudoephedrine (SUDAFED 12 HOUR) 120 MG 12 hr tablet Take 120 mg by mouth 2 (two) times daily.    [provider]  Pseudoephedrine HCl (SUDAFED PO) Take 1 tablet  by mouth every 12 (twelve) hours as needed (congestions). 325-10-10-5    [provider]  tiZANidine (ZANAFLEX) 4 MG tablet Take 1 tablet (4 mg total) by mouth every 8 (eight) hours as needed for muscle spasms. 10/17/17   Kathryne Hitch, MD  traMADol (ULTRAM) 50 MG tablet Take by mouth every 6 (six) hours as needed.    [provider]      Allergies    Patient has no known allergies.    Review of Systems   Review of Systems  Physical Exam Updated Vital Signs BP (!) 118/58 (BP Location: Right Arm)    Pulse 72   Temp 98 F (36.7 C)   Resp 19   Ht 5\' 4"  (1.626 m)   Wt 124.7 kg   LMP 02/23/2012   SpO2 99%   BMI 47.20 kg/m  Physical Exam Vitals and nursing note reviewed.  Constitutional:      General: She is not in acute distress.    Appearance: She is well-developed. She is obese.  HENT:     Head: Normocephalic and atraumatic.  Eyes:     Conjunctiva/sclera: Conjunctivae normal.  Cardiovascular:     Rate and Rhythm: Normal rate and regular rhythm.     Heart sounds: No murmur heard. Pulmonary:     Effort: Pulmonary effort is normal. No respiratory distress.     Breath sounds: Normal breath sounds.  Chest:     Chest wall: No tenderness.  Abdominal:     Palpations: Abdomen is soft.     Tenderness: There is no abdominal tenderness.  Musculoskeletal:        General: No swelling.     Cervical back: Neck supple.  Skin:    General: Skin is warm and dry.     Capillary Refill: Capillary refill takes less than 2 seconds.  Neurological:     Mental Status: She is alert.  Psychiatric:        Mood and Affect: Mood normal.     ED Results / Procedures / Treatments   Labs (all labs ordered are listed, but only abnormal results are displayed) Labs Reviewed  BASIC METABOLIC PANEL - Abnormal; Notable for the following components:      Result Value   BUN 7 (*)    Anion gap 17 (*)    All other components within normal limits  CBC  TROPONIN I (HIGH SENSITIVITY)  TROPONIN I (HIGH SENSITIVITY)    EKG None  Radiology CT Chest Wo Contrast  Result Date: 12/30/2022 CLINICAL DATA:  Abnormal x-ray, lung nodule. EXAM: CT CHEST WITHOUT CONTRAST TECHNIQUE: Multidetector CT imaging of the chest was performed following the standard protocol without IV contrast. RADIATION DOSE REDUCTION: This exam was performed according to the departmental dose-optimization program which includes automated exposure control, adjustment of the mA and/or kV according to patient size and/or use of iterative  reconstruction technique. COMPARISON:  12/29/2022. FINDINGS: Cardiovascular: The heart is normal in size and there is a trace pericardial effusion. The aorta and pulmonary trunk are normal in caliber. Mediastinum/Nodes: No mediastinal or axillary lymphadenopathy. Evaluation of the hila is limited due to lack of IV contrast. A few coarse calcifications are noted in the right lobe of the thyroid gland. The trachea and esophagus are within normal limits. There is a small hiatal hernia. Lungs/Pleura: Mild atelectasis or scarring is present at the lung bases. No effusion or pneumothorax. No pulmonary nodule or mass is seen. Upper Abdomen: The gallbladder is surgically absent. No acute  abnormality. Musculoskeletal: Degenerative changes are present in the thoracic spine. No acute osseous abnormality. IMPRESSION: No evidence of pulmonary nodule. Previously described radiographic density may be due to overlapping structures. Electronically Signed   By: Thornell Sartorius M.D.   On: 12/30/2022 04:10   DG Chest 2 View  Result Date: 12/29/2022 CLINICAL DATA:  Chest tightness. Lightheadedness. Back pain. Headache. EXAM: CHEST - 2 VIEW COMPARISON:  04/23/2011 FINDINGS: Although possibly representing a confluence of vascular and osseous shadows, I cannot exclude 1.3 cm right upper lobe nodule projecting over the right posterior fifth rib. Noncontrast chest CT is recommended to exclude true pulmonary nodule/lung cancer. No secondary signs of adenopathy.  Lower thoracic spondylosis. IMPRESSION: 1. Possible 1.3 cm right upper lobe nodule. Noncontrast chest CT is recommended to exclude true pulmonary nodule/lung cancer. 2. Lower thoracic spondylosis. Electronically Signed   By: Gaylyn Rong M.D.   On: 12/29/2022 19:57    Procedures Procedures    Medications Ordered in ED Medications - No data to display  ED Course/ Medical Decision Making/ A&P                                 Medical Decision Making Amount and/or  Complexity of Data Reviewed Labs: ordered. Radiology: ordered.   This patient presents to the ED for concern of chest pain, this involves an extensive number of treatment options, and is a complaint that carries with it a high risk of complications and morbidity.  The differential diagnosis includes ACS, dissection, pneumonia, PE, anxiety, musculoskeletal pain, others   Co morbidities that complicate the patient evaluation  Asthma   Additional history obtained:  Additional history obtained from family at bedside  Lab Tests:  I Ordered, and personally interpreted labs.  The pertinent results include: Initial and repeat troponin 3, unremarkable BMP, unremarkable CBC   Imaging Studies ordered:  I ordered imaging studies including chest x-ray and CT chest without contrast I independently visualized and interpreted imaging which showed possible right upper lobe nodule on chest x-ray, recommended Noncon CT for further evaluation which showed no pulmonary nodules and no acute abnormalities. I agree with the radiologist interpretation   Cardiac Monitoring: / EKG:  The patient was maintained on a cardiac monitor.  I personally viewed and interpreted the cardiac monitored which showed an underlying rhythm of: Normal sinus rhythm   Test / Admission - Considered:  Patient's symptoms subsided on their own without any medication.  Nonischemic EKG with negative troponins x 2.  No sign of ACS.  No shortness of breath to suggest pulmonary embolism.  No clinical signs of dissection.  No pneumonia on chest x-ray no reported anxiety/nervousness.  Unclear etiology of patient's pain at this time with resolution of symptoms feel the patient is stable for discharge home.  Plan to provide referral to cardiology for further evaluation in the outpatient setting.  Return precautions provided including return of chest pain, shortness of breath         Final Clinical Impression(s) / ED  Diagnoses Final diagnoses:  Chest pain, unspecified type    Rx / DC Orders ED Discharge Orders          Ordered    Ambulatory referral to Cardiology       Comments: If you have not heard from the Cardiology office within the next 72 hours please call [phone].   12/30/22 0423  Pamala Duffel 12/30/22 0424    Palumbo, April, MD 12/30/22 [phone]

## 2023-02-28 DIAGNOSIS — E039 Hypothyroidism, unspecified: Secondary | ICD-10-CM | POA: Diagnosis not present

## 2023-02-28 DIAGNOSIS — R7301 Impaired fasting glucose: Secondary | ICD-10-CM | POA: Diagnosis not present

## 2023-03-08 DIAGNOSIS — E039 Hypothyroidism, unspecified: Secondary | ICD-10-CM | POA: Diagnosis not present

## 2023-03-08 DIAGNOSIS — R7301 Impaired fasting glucose: Secondary | ICD-10-CM | POA: Diagnosis not present

## 2023-04-17 IMAGING — MG MM DIGITAL SCREENING BILAT W/ TOMO AND CAD
6 of 10 series · 6 of 30 positions shown · non-contrast
Comparison: Previous exam(s).

ACR Breast Density Category a: The breast tissue is almost entirely
fatty.

CLINICAL DATA: Screening.

EXAM:
DIGITAL SCREENING BILATERAL MAMMOGRAM WITH TOMOSYNTHESIS AND CAD
TECHNIQUE: Bilateral screening digital craniocaudal and mediolateral oblique
mammograms were obtained. Bilateral screening digital breast
tomosynthesis was performed. The images were evaluated with
computer-aided detection.

[R CC synth-2D (1 of 2)]
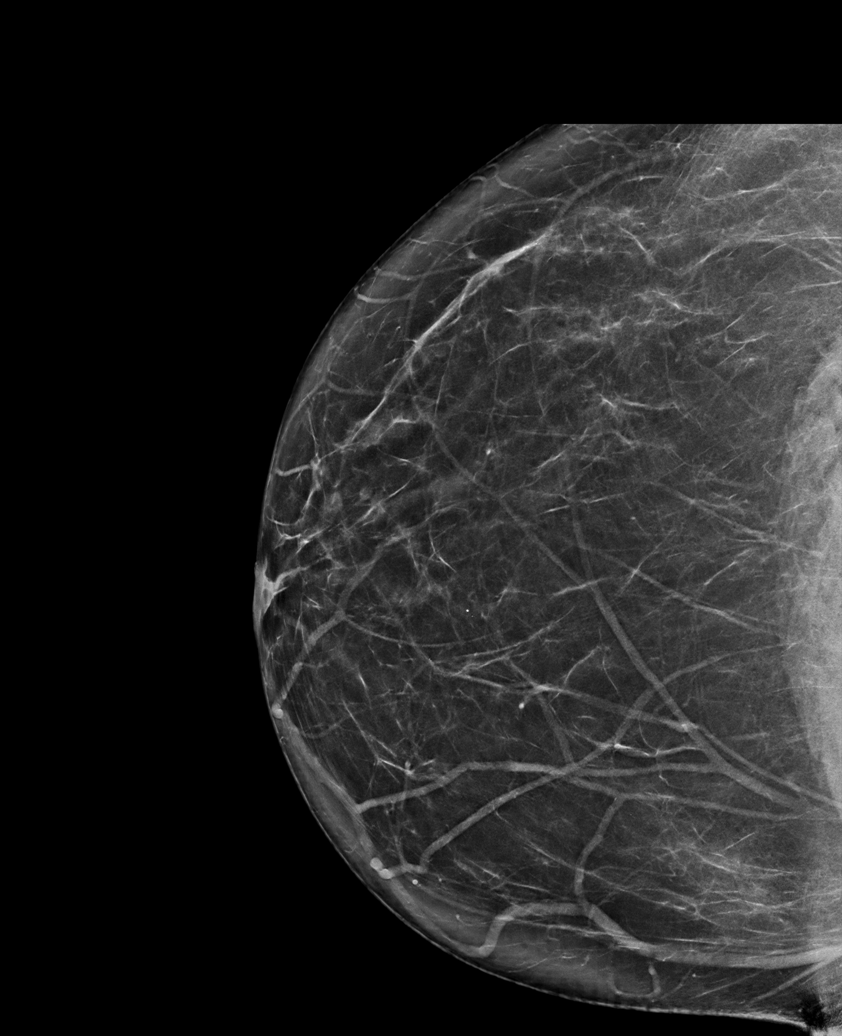

[R CC synth-2D (2 of 2)]
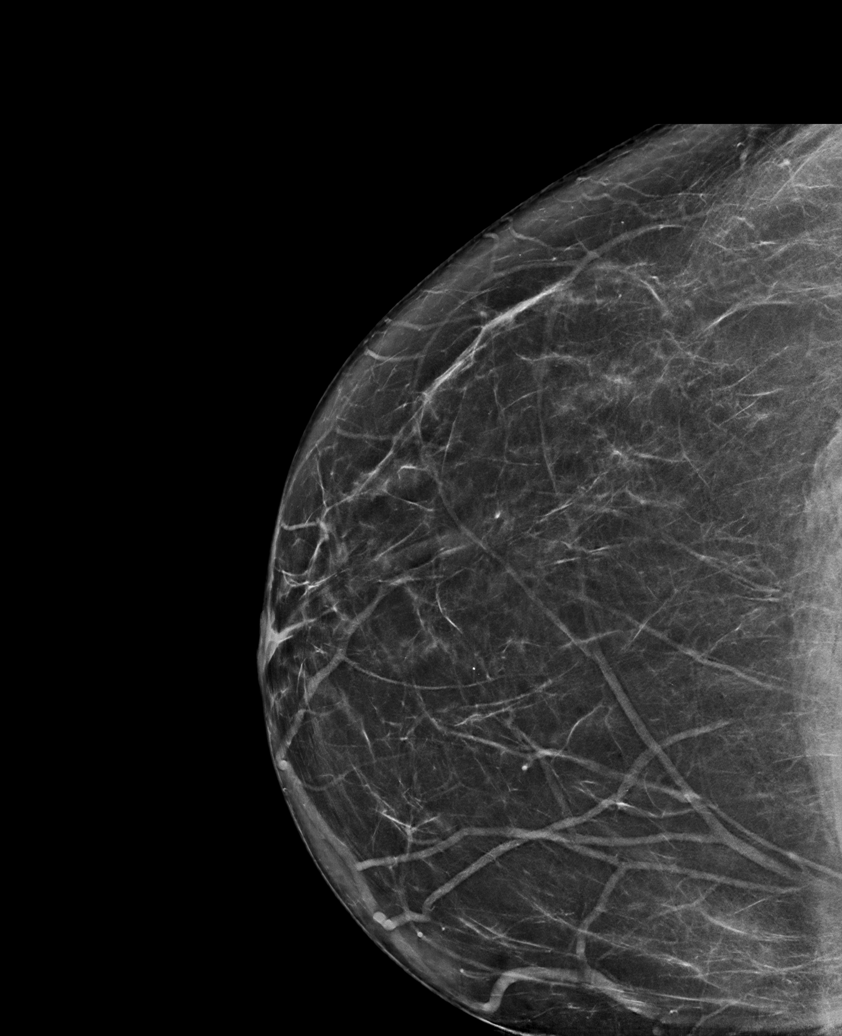

[L CC synth-2D]
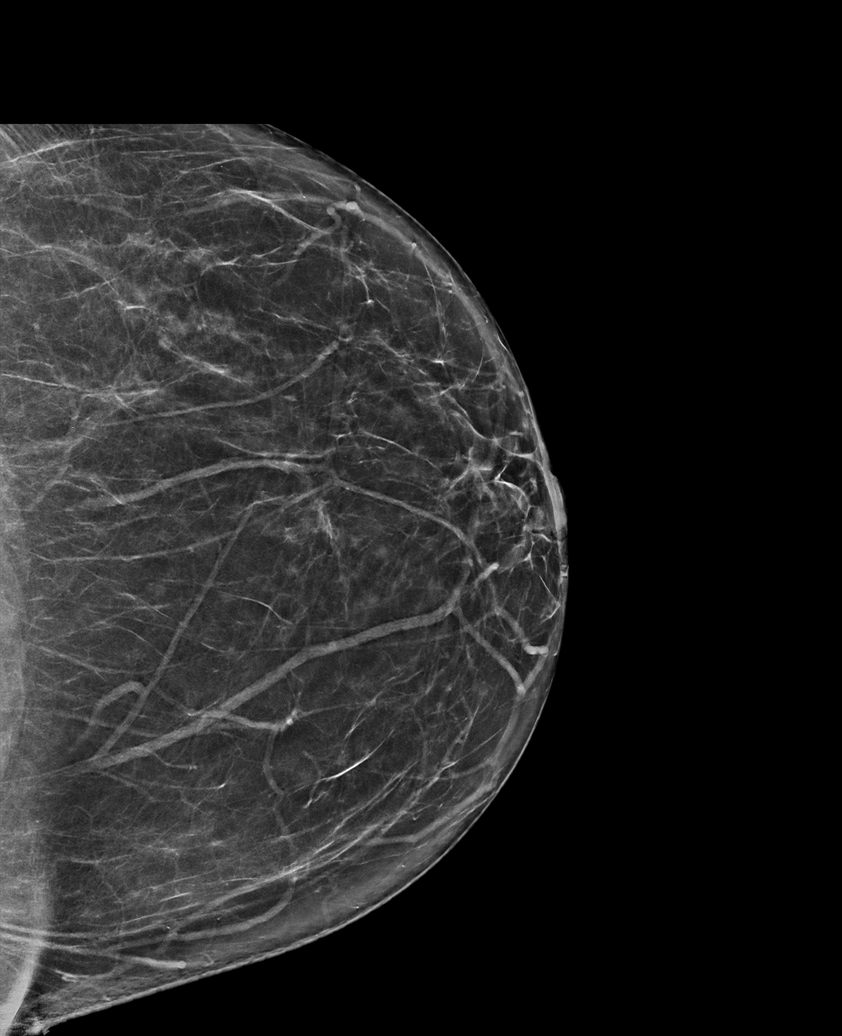

[R MLO synth-2D]
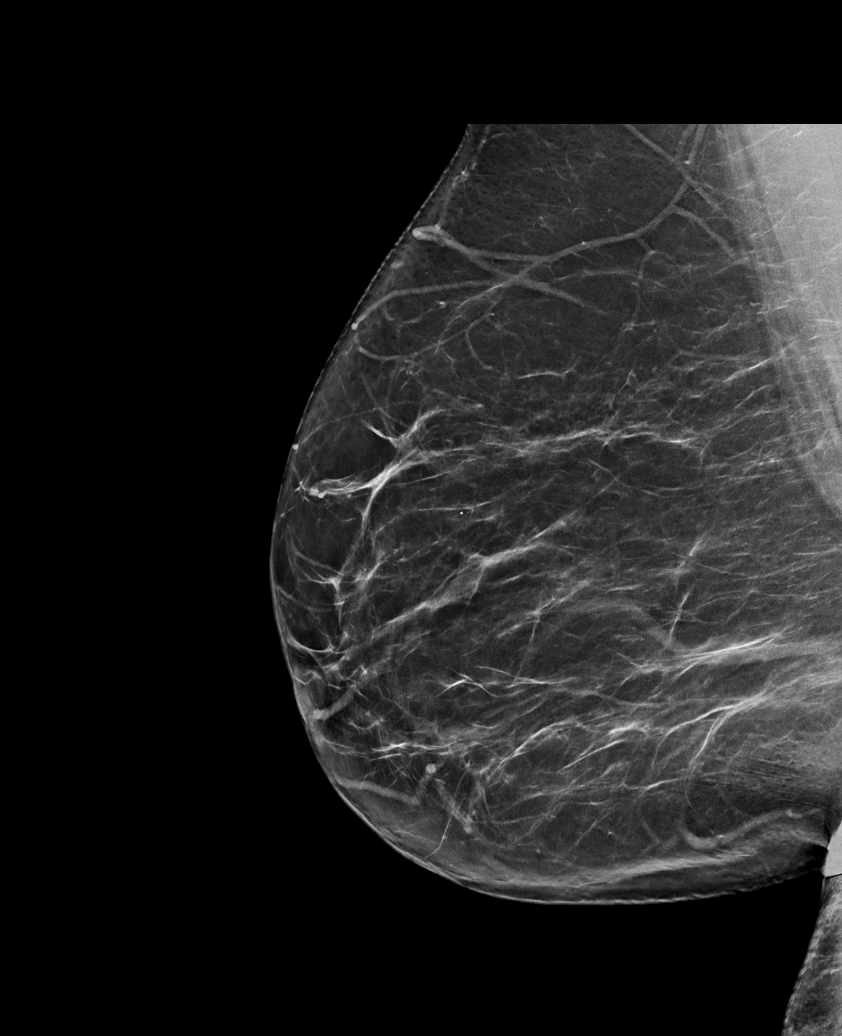

[L MLO synth-2D]
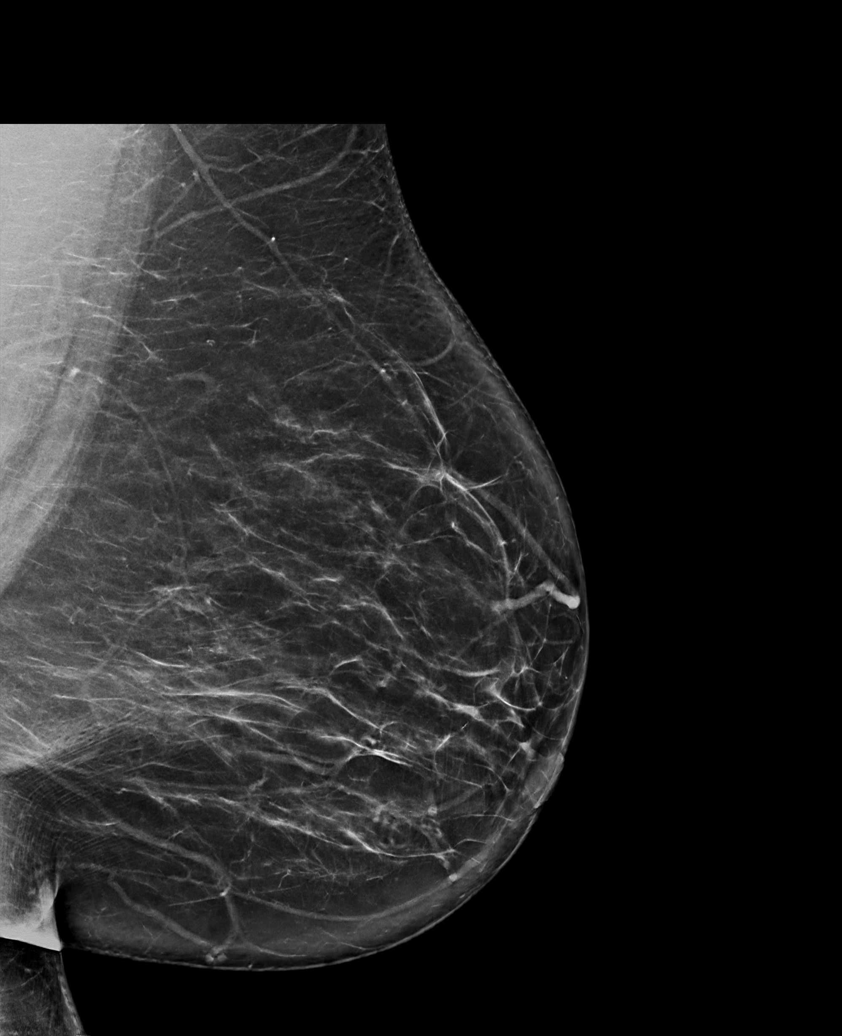

[R CC tomo · tomo slice 39/77.0]
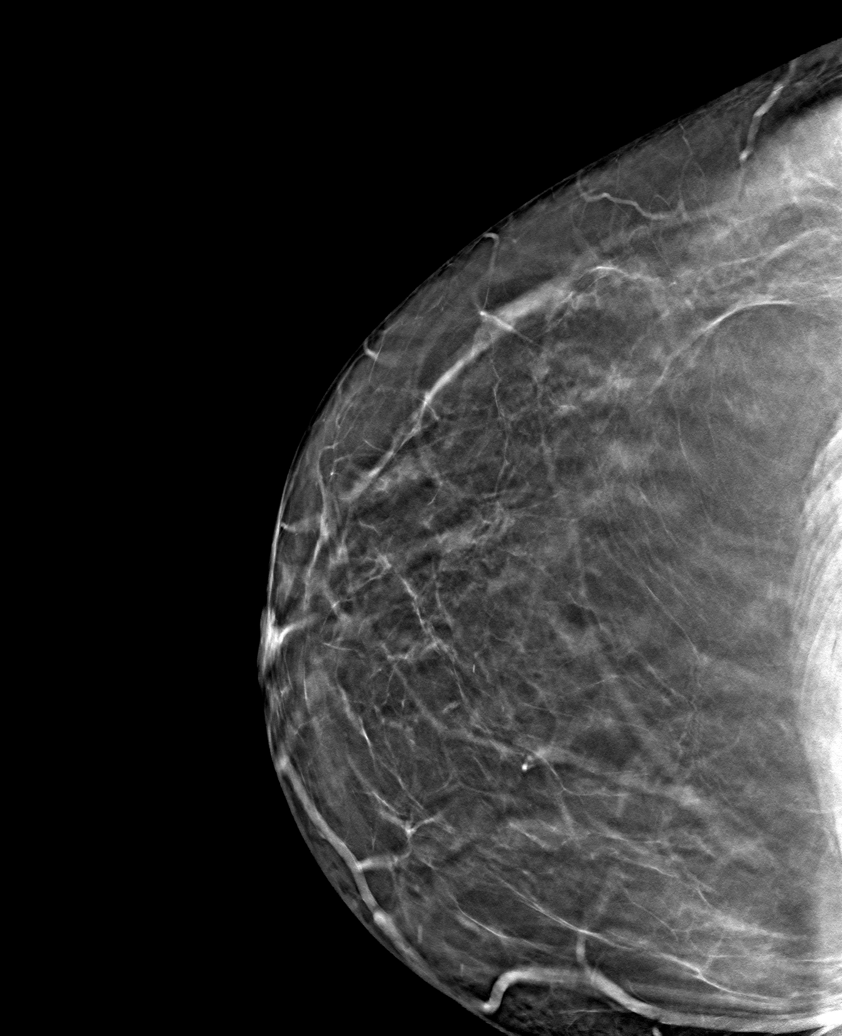

[6 of 30 positions shown; findings below may reference images not displayed]

FINDINGS: There are no findings suspicious for malignancy.
IMPRESSION: No mammographic evidence of malignancy. A result letter of this
screening mammogram will be mailed directly to the patient.

RECOMMENDATION:
Screening mammogram in one year. (Code:0E-3-N98)

BI-RADS CATEGORY  1: Negative.

## 2023-05-22 DIAGNOSIS — J019 Acute sinusitis, unspecified: Secondary | ICD-10-CM | POA: Diagnosis not present

## 2023-06-24 DIAGNOSIS — J029 Acute pharyngitis, unspecified: Secondary | ICD-10-CM | POA: Diagnosis not present

## 2023-06-24 DIAGNOSIS — N3000 Acute cystitis without hematuria: Secondary | ICD-10-CM | POA: Diagnosis not present

## 2023-06-24 DIAGNOSIS — R52 Pain, unspecified: Secondary | ICD-10-CM | POA: Diagnosis not present

## 2023-06-24 DIAGNOSIS — R300 Dysuria: Secondary | ICD-10-CM | POA: Diagnosis not present

## 2023-06-24 DIAGNOSIS — J069 Acute upper respiratory infection, unspecified: Secondary | ICD-10-CM | POA: Diagnosis not present

## 2023-06-24 DIAGNOSIS — B9789 Other viral agents as the cause of diseases classified elsewhere: Secondary | ICD-10-CM | POA: Diagnosis not present

## 2023-06-29 DIAGNOSIS — Z0000 Encounter for general adult medical examination without abnormal findings: Secondary | ICD-10-CM | POA: Diagnosis not present

## 2023-07-06 DIAGNOSIS — R8290 Unspecified abnormal findings in urine: Secondary | ICD-10-CM | POA: Diagnosis not present

## 2023-07-06 DIAGNOSIS — E66813 Obesity, class 3: Secondary | ICD-10-CM | POA: Diagnosis not present

## 2023-07-06 DIAGNOSIS — Z0000 Encounter for general adult medical examination without abnormal findings: Secondary | ICD-10-CM | POA: Diagnosis not present

## 2023-07-06 DIAGNOSIS — E039 Hypothyroidism, unspecified: Secondary | ICD-10-CM | POA: Diagnosis not present

## 2023-11-21 DIAGNOSIS — T733XXA Exhaustion due to excessive exertion, initial encounter: Secondary | ICD-10-CM | POA: Diagnosis not present

## 2023-11-21 DIAGNOSIS — R42 Dizziness and giddiness: Secondary | ICD-10-CM | POA: Diagnosis not present

## 2023-11-21 DIAGNOSIS — M7989 Other specified soft tissue disorders: Secondary | ICD-10-CM | POA: Diagnosis not present

## 2024-02-16 DIAGNOSIS — K219 Gastro-esophageal reflux disease without esophagitis: Secondary | ICD-10-CM | POA: Diagnosis not present

## 2024-02-16 DIAGNOSIS — R1032 Left lower quadrant pain: Secondary | ICD-10-CM | POA: Diagnosis not present

## 2024-02-16 DIAGNOSIS — R110 Nausea: Secondary | ICD-10-CM | POA: Diagnosis not present

## 2024-02-16 DIAGNOSIS — N898 Other specified noninflammatory disorders of vagina: Secondary | ICD-10-CM | POA: Diagnosis not present

## 2024-02-17 DIAGNOSIS — R1032 Left lower quadrant pain: Secondary | ICD-10-CM | POA: Diagnosis not present

## 2024-02-17 DIAGNOSIS — K760 Fatty (change of) liver, not elsewhere classified: Secondary | ICD-10-CM | POA: Diagnosis not present

## 2024-02-23 ENCOUNTER — Other Ambulatory Visit: Payer: Self-pay | Admitting: Family Medicine

## 2024-02-23 DIAGNOSIS — Z1231 Encounter for screening mammogram for malignant neoplasm of breast: Secondary | ICD-10-CM

## 2024-03-20 DIAGNOSIS — R7301 Impaired fasting glucose: Secondary | ICD-10-CM | POA: Diagnosis not present

## 2024-03-20 DIAGNOSIS — E039 Hypothyroidism, unspecified: Secondary | ICD-10-CM | POA: Diagnosis not present

## 2024-03-27 ENCOUNTER — Ambulatory Visit
Admission: RE | Admit: 2024-03-27 | Discharge: 2024-03-27 | Disposition: A | Source: Ambulatory Visit | Attending: Family Medicine | Admitting: Family Medicine

## 2024-03-27 DIAGNOSIS — Z1231 Encounter for screening mammogram for malignant neoplasm of breast: Secondary | ICD-10-CM

## 2024-03-27 DIAGNOSIS — E039 Hypothyroidism, unspecified: Secondary | ICD-10-CM | POA: Diagnosis not present

## 2024-03-27 DIAGNOSIS — R7301 Impaired fasting glucose: Secondary | ICD-10-CM | POA: Diagnosis not present

## 2024-03-28 DIAGNOSIS — R8290 Unspecified abnormal findings in urine: Secondary | ICD-10-CM | POA: Diagnosis not present

## 2024-03-28 DIAGNOSIS — N898 Other specified noninflammatory disorders of vagina: Secondary | ICD-10-CM | POA: Diagnosis not present
